# Patient Record
Sex: Male | Born: 1960 | Race: White | Hispanic: No | Marital: Married | Smoking: Never smoker
Health system: Southern US, Community
[De-identification: ages and names within clinical notes are randomized; demographics above are authoritative.]

## PROBLEM LIST (undated history)

## (undated) DIAGNOSIS — K5792 Diverticulitis of intestine, part unspecified, without perforation or abscess without bleeding: Secondary | ICD-10-CM

## (undated) DIAGNOSIS — R269 Unspecified abnormalities of gait and mobility: Secondary | ICD-10-CM

## (undated) DIAGNOSIS — M79673 Pain in unspecified foot: Secondary | ICD-10-CM

## (undated) DIAGNOSIS — K5730 Diverticulosis of large intestine without perforation or abscess without bleeding: Secondary | ICD-10-CM

## (undated) DIAGNOSIS — E785 Hyperlipidemia, unspecified: Secondary | ICD-10-CM

## (undated) DIAGNOSIS — M25522 Pain in left elbow: Secondary | ICD-10-CM

## (undated) DIAGNOSIS — K635 Polyp of colon: Secondary | ICD-10-CM

## (undated) DIAGNOSIS — D239 Other benign neoplasm of skin, unspecified: Secondary | ICD-10-CM

## (undated) DIAGNOSIS — Z8739 Personal history of other diseases of the musculoskeletal system and connective tissue: Secondary | ICD-10-CM

## (undated) DIAGNOSIS — M2170 Unequal limb length (acquired), unspecified site: Secondary | ICD-10-CM

## (undated) DIAGNOSIS — M1990 Unspecified osteoarthritis, unspecified site: Secondary | ICD-10-CM

## (undated) DIAGNOSIS — M7580 Other shoulder lesions, unspecified shoulder: Secondary | ICD-10-CM

## (undated) DIAGNOSIS — T7840XA Allergy, unspecified, initial encounter: Secondary | ICD-10-CM

## (undated) DIAGNOSIS — M84362G Stress fracture, left tibia, subsequent encounter for fracture with delayed healing: Secondary | ICD-10-CM

## (undated) DIAGNOSIS — Z8619 Personal history of other infectious and parasitic diseases: Secondary | ICD-10-CM

## (undated) HISTORY — DX: Pain in unspecified foot: M79.673

## (undated) HISTORY — DX: Personal history of other infectious and parasitic diseases: Z86.19

## (undated) HISTORY — DX: Other benign neoplasm of skin, unspecified: D23.9

## (undated) HISTORY — DX: Allergy, unspecified, initial encounter: T78.40XA

## (undated) HISTORY — PX: COLONOSCOPY: SHX174

## (undated) HISTORY — PX: SHOULDER SURGERY: SHX246

## (undated) HISTORY — DX: Unspecified osteoarthritis, unspecified site: M19.90

## (undated) HISTORY — DX: Polyp of colon: K63.5

## (undated) HISTORY — DX: Personal history of other diseases of the musculoskeletal system and connective tissue: Z87.39

## (undated) HISTORY — DX: Pain in left elbow: M25.522

## (undated) HISTORY — DX: Unspecified abnormalities of gait and mobility: R26.9

## (undated) HISTORY — PX: POLYPECTOMY: SHX149

## (undated) HISTORY — DX: Diverticulosis of large intestine without perforation or abscess without bleeding: K57.30

## (undated) HISTORY — DX: Hyperlipidemia, unspecified: E78.5

## (undated) HISTORY — DX: Other shoulder lesions, unspecified shoulder: M75.80

## (undated) HISTORY — DX: Unequal limb length (acquired), unspecified site: M21.70

## (undated) HISTORY — DX: Diverticulitis of intestine, part unspecified, without perforation or abscess without bleeding: K57.92

## (undated) HISTORY — DX: Stress fracture, left tibia, subsequent encounter for fracture with delayed healing: M84.362G

---

## 1986-05-02 HISTORY — PX: OTHER SURGICAL HISTORY: SHX169

## 1996-05-02 HISTORY — PX: VASECTOMY: SHX75

## 1997-05-02 HISTORY — PX: APPENDECTOMY: SHX54

## 1997-10-18 ENCOUNTER — Inpatient Hospital Stay (HOSPITAL_COMMUNITY): Admission: RE | Admit: 1997-10-18 | Discharge: 1997-10-19 | Payer: Self-pay | Admitting: Surgery

## 2001-05-02 HISTORY — PX: REFRACTIVE SURGERY: SHX103

## 2005-08-08 ENCOUNTER — Ambulatory Visit: Payer: Self-pay | Admitting: Internal Medicine

## 2005-08-24 ENCOUNTER — Ambulatory Visit: Payer: Self-pay | Admitting: Internal Medicine

## 2006-01-06 ENCOUNTER — Ambulatory Visit: Payer: Self-pay | Admitting: Internal Medicine

## 2006-07-20 ENCOUNTER — Ambulatory Visit: Payer: Self-pay | Admitting: Internal Medicine

## 2006-07-20 LAB — CONVERTED CEMR LAB
HDL: 34.3 mg/dL — ABNORMAL LOW (ref >39.0)
VLDL: 12 mg/dL (ref 0–40)

## 2006-11-14 ENCOUNTER — Ambulatory Visit: Payer: Self-pay | Admitting: Internal Medicine

## 2006-11-14 DIAGNOSIS — E785 Hyperlipidemia, unspecified: Secondary | ICD-10-CM | POA: Insufficient documentation

## 2006-11-14 LAB — CONVERTED CEMR LAB
Cholesterol, target level: 200 mg/dL
HDL goal, serum: 40 mg/dL
LDL Goal: 130 mg/dL

## 2006-11-17 ENCOUNTER — Encounter: Payer: Self-pay | Admitting: Internal Medicine

## 2006-11-17 ENCOUNTER — Encounter (INDEPENDENT_AMBULATORY_CARE_PROVIDER_SITE_OTHER): Payer: Self-pay | Admitting: *Deleted

## 2006-11-17 LAB — CONVERTED CEMR LAB
AST: 30 units/L (ref 0–37)
Cholesterol: 93 mg/dL (ref 0–200)

## 2007-03-16 ENCOUNTER — Ambulatory Visit: Payer: Self-pay | Admitting: Internal Medicine

## 2007-03-23 LAB — CONVERTED CEMR LAB
Cholesterol: 119 mg/dL (ref 0–200)
HDL: 32.1 mg/dL — ABNORMAL LOW (ref >39.0)
Total CHOL/HDL Ratio: 3.7
Triglycerides: 55 mg/dL (ref 0–149)

## 2007-04-24 ENCOUNTER — Ambulatory Visit: Payer: Self-pay | Admitting: Internal Medicine

## 2007-04-24 DIAGNOSIS — R109 Unspecified abdominal pain: Secondary | ICD-10-CM | POA: Insufficient documentation

## 2007-04-24 DIAGNOSIS — R350 Frequency of micturition: Secondary | ICD-10-CM

## 2007-04-24 DIAGNOSIS — C4491 Basal cell carcinoma of skin, unspecified: Secondary | ICD-10-CM | POA: Insufficient documentation

## 2007-04-24 LAB — CONVERTED CEMR LAB
Protein, U semiquant: NEGATIVE
Urobilinogen, UA: NEGATIVE
WBC Urine, dipstick: NEGATIVE

## 2007-04-25 ENCOUNTER — Encounter: Payer: Self-pay | Admitting: Internal Medicine

## 2007-05-23 ENCOUNTER — Encounter: Payer: Self-pay | Admitting: Internal Medicine

## 2008-01-09 ENCOUNTER — Ambulatory Visit: Payer: Self-pay | Admitting: Internal Medicine

## 2008-01-09 DIAGNOSIS — R109 Unspecified abdominal pain: Secondary | ICD-10-CM | POA: Insufficient documentation

## 2008-01-09 LAB — CONVERTED CEMR LAB
Blood in Urine, dipstick: NEGATIVE
HDL goal, serum: 40 mg/dL
Ketones, urine, test strip: NEGATIVE
LDL Goal: 100 mg/dL
Nitrite: NEGATIVE
Protein, U semiquant: NEGATIVE
WBC Urine, dipstick: NEGATIVE
pH: 7

## 2008-01-14 ENCOUNTER — Encounter (INDEPENDENT_AMBULATORY_CARE_PROVIDER_SITE_OTHER): Payer: Self-pay | Admitting: *Deleted

## 2008-01-14 LAB — CONVERTED CEMR LAB
Albumin: 4 g/dL (ref 3.5–5.2)
Alkaline Phosphatase: 84 units/L (ref 39–117)
Basophils Absolute: 0.1 10*3/uL (ref 0.0–0.1)
Basophils Relative: 0.8 % (ref 0.0–3.0)
Cholesterol: 110 mg/dL (ref 0–200)
Eosinophils Absolute: 0.5 10*3/uL (ref 0.0–0.7)
HCT: 43.5 % (ref 39.0–52.0)
HDL: 24.6 mg/dL — ABNORMAL LOW (ref >39.0)
MCHC: 35.7 g/dL (ref 30.0–36.0)
MCV: 92.2 fL (ref 78.0–100.0)
Monocytes Absolute: 0.6 10*3/uL (ref 0.1–1.0)
Neutrophils Relative %: 50.6 % (ref 43.0–77.0)
RBC: 4.71 M/uL (ref 4.22–5.81)
Total Protein: 7.1 g/dL (ref 6.0–8.3)
VLDL: 11 mg/dL (ref 0–40)

## 2008-01-25 ENCOUNTER — Ambulatory Visit: Payer: Self-pay | Admitting: Internal Medicine

## 2008-01-25 ENCOUNTER — Encounter (INDEPENDENT_AMBULATORY_CARE_PROVIDER_SITE_OTHER): Payer: Self-pay | Admitting: *Deleted

## 2008-01-25 LAB — CONVERTED CEMR LAB
OCCULT 1: NEGATIVE
OCCULT 2: NEGATIVE
OCCULT 3: NEGATIVE

## 2009-03-19 ENCOUNTER — Telehealth (INDEPENDENT_AMBULATORY_CARE_PROVIDER_SITE_OTHER): Payer: Self-pay | Admitting: *Deleted

## 2009-11-06 ENCOUNTER — Encounter: Payer: Self-pay | Admitting: Internal Medicine

## 2010-06-01 NOTE — Letter (Signed)
Summary: [facility]  [facility]   Imported By: Lennie Odor 11/26/2009 09:21:12  _____________________________________________________________________  External Attachment:    Type:   Image     Comment:   External Document

## 2010-06-17 ENCOUNTER — Encounter: Payer: Self-pay | Admitting: Internal Medicine

## 2010-06-17 ENCOUNTER — Other Ambulatory Visit: Payer: Self-pay | Admitting: Internal Medicine

## 2010-06-17 ENCOUNTER — Ambulatory Visit (INDEPENDENT_AMBULATORY_CARE_PROVIDER_SITE_OTHER): Payer: 59 | Admitting: Internal Medicine

## 2010-06-17 DIAGNOSIS — Z0000 Encounter for general adult medical examination without abnormal findings: Secondary | ICD-10-CM

## 2010-06-17 DIAGNOSIS — E785 Hyperlipidemia, unspecified: Secondary | ICD-10-CM

## 2010-06-17 DIAGNOSIS — T887XXA Unspecified adverse effect of drug or medicament, initial encounter: Secondary | ICD-10-CM

## 2010-06-17 DIAGNOSIS — M79609 Pain in unspecified limb: Secondary | ICD-10-CM

## 2010-06-17 DIAGNOSIS — Z125 Encounter for screening for malignant neoplasm of prostate: Secondary | ICD-10-CM

## 2010-06-17 LAB — HEPATIC FUNCTION PANEL
Albumin: 4.2 g/dL (ref 3.5–5.2)
Total Bilirubin: 0.8 mg/dL (ref 0.3–1.2)

## 2010-06-17 LAB — CBC WITH DIFFERENTIAL/PLATELET
Basophils Absolute: 0 10*3/uL (ref 0.0–0.1)
Eosinophils Absolute: 0.4 10*3/uL (ref 0.0–0.7)
Lymphocytes Relative: 24.5 % (ref 12.0–46.0)
MCHC: 34.5 g/dL (ref 30.0–36.0)
Neutrophils Relative %: 60.6 % (ref 43.0–77.0)
RDW: 12.3 % (ref 11.5–14.6)

## 2010-06-17 LAB — BASIC METABOLIC PANEL
BUN: 16 mg/dL (ref 6–23)
Calcium: 9.3 mg/dL (ref 8.4–10.5)
GFR: 75.13 mL/min (ref >60.00)
Glucose, Bld: 91 mg/dL (ref 70–99)
Sodium: 141 mEq/L (ref 135–145)

## 2010-06-17 LAB — PSA: PSA: 0.47 ng/mL (ref 0.10–4.00)

## 2010-06-23 NOTE — Assessment & Plan Note (Signed)
Summary: pain in left shin///sph--has cpx on 3/30--can he get his labs...   Vital Signs:  Patient profile:   50 year old male Height:      71.5 inches Weight:      217 pounds BMI:     29.95 Temp:     97.5 degrees F oral Pulse rate:   80 / minute Resp:     14 per minute BP sitting:   132 / 88  (left arm) Cuff size:   large  Vitals Entered By: Shonna Chock CMA (June 17, 2010 8:19 AM) CC: Pain left shin x 7-8 months, no known cause . Fasting for labs (pending CPX) , Lower Extremity Joint pain   CC:  Pain left shin x 7-8 months, no known cause . Fasting for labs (pending CPX) , and Lower Extremity Joint pain.  History of Present Illness:    Mr. Keith Schmitt has had waxing & waning  L shin pain  X 7 months ; this has been an intermittent issue for years. It started 6 months  after he "rolled" his L ankle in 05/2009. He also has had intermittent L  elbow pain which he attributed to Vytorin, but it recurred off the statin. This was diagnosed as soft tissue injury based on MRI by [facility].   The patient denies swelling, redness, and weakness.  The pain is described as sharp and activity related.  The patient denies the following symptoms: fever, rash, photosensitivity, eye symptoms, diarrhea, and dysuria. Rx: compression device , ice & heat have  helped some.  Allergies: No Known Drug Allergies  Physical Exam  General:  well-nourished,in no acute distress; alert,appropriate and cooperative throughout examination Pulses:  R and L dorsalis pedis and posterior tibial pulses are full and equal bilaterally Extremities:  No clubbing, cyanosis, edema, or deformity noted .Neg Homan's ; slight tenderness L shin lateral to Tibia Neurologic:  alert & oriented X3, strength normal in all extremities, and DTRs symmetrical and normal.   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 1 by mouth qd 2)  Adult Aspirin Ec Low Strength 81 Mg Tbec (Aspirin) .Marland Kitchen.. 1 by mouth qd  Patient Instructions: 1)  Glucosamine -chondroitin once daily until seen by Dr Darrick Penna. .   Orders Added: 1)  Est. Patient Level III [86578] 2)  Sports Medicine [Sports Med]  Appended Document: pain in left shin///sph--has cpx on 3/30--can he get his labs.Marland KitchenMarland Kitchen

## 2010-06-28 ENCOUNTER — Encounter: Payer: Self-pay | Admitting: Internal Medicine

## 2010-07-07 ENCOUNTER — Ambulatory Visit (INDEPENDENT_AMBULATORY_CARE_PROVIDER_SITE_OTHER): Payer: 59 | Admitting: Sports Medicine

## 2010-07-07 ENCOUNTER — Encounter: Payer: Self-pay | Admitting: Sports Medicine

## 2010-07-07 DIAGNOSIS — M2170 Unequal limb length (acquired), unspecified site: Secondary | ICD-10-CM

## 2010-07-07 DIAGNOSIS — R269 Unspecified abnormalities of gait and mobility: Secondary | ICD-10-CM

## 2010-07-07 DIAGNOSIS — M79609 Pain in unspecified limb: Secondary | ICD-10-CM

## 2010-07-13 NOTE — Letter (Signed)
Summary: *Consult Note  [facility]  [address]   [place], [place] [address]   Phone: [phone]  Fax: [phone]    Re:    TAMER BAUGHMAN DOB:    Aug 27, 1960 Marga Melnick, MD [facility] 07/07/10   Dear Alfonse Flavors:    Thank you for requesting that we see the above patient for consultation.  A copy of the detailed office note will be sent under separate cover, for your review.  Evaluation today is consistent with:  1)  ABNORMALITY OF GAIT (ICD-781.2) 2)  UNEQUAL LEG LENGTH (ICD-736.81) 3) chronic left leg pain   Our recommendation is for: biomechanical correction of his significant changes with tibial torsion, unequal leg length and trendelenburg gait with excessive forefoot pronation cycle.  If this helps we will make him a permanent orthotic to relieve this stress.   New Orders include:  1)  Consultation Level III [13086]   New Medications started today include:    After today's visit, the patients current medications include: 1)  NIASPAN 500 MG TBCR (NIACIN (ANTIHYPERLIPIDEMIC)) 1 by mouth qd 2)  ADULT ASPIRIN EC LOW STRENGTH 81 MG  TBEC (ASPIRIN) 1 by mouth qd   Thank you for this consultation.  If you have any further questions regarding the care of this patient, please do not hesitate to contact me @ [phone].  Thank you for this opportunity to look after your patient.  Sincerely,  Vincent Gros MD

## 2010-07-13 NOTE — Assessment & Plan Note (Signed)
Summary: NP,L LEG PAIN,PER DR.HOPPER,MC   Vital Signs:  Patient profile:   50 year old male Height:      72 inches Weight:      212 pounds Pulse rate:   66 / minute BP sitting:   155 / 90  (right arm)  Vitals Entered By: Rochele Pages RN (July 07, 2010 10:20 AM) CC: lt shin pain x 8 months,    CC:  lt shin pain x 8 months and .  History of Present Illness: Pt presents to clinic for evaluation of left shin pain x 8 months.   Has hx of shin splint since age 49, but has always improved after 1-2 weeks of rest until this episode. Hx of sprained ankle Jan 2011- top of foot.  Had continued pain for 5 months. Stayed off of ankle for 6 months while ankle was healing.  Started having L shin pain Jul 2011 with everyday walking. Aug 2011 developed lt heel tenderness- started using gel insoles and new tennis shoes with heel support.   Oct and Nov 2011 shin pain severe.  Ibuprofen and ice not significantly helpful.  Since then has gradually improved some  However, he avoids running and is concerned that increased activity will cause return or worse pain   Preventive Screening-Counseling & Management  Alcohol-Tobacco     Smoking Status: never  Allergies (verified): No Known Drug Allergies  Physical Exam  General:  Well-developed,well-nourished,in no acute distress; alert,appropriate and cooperative throughout examination Msk:  Irregularity of ant tib border on palpatation L>R Atrophy of lt calf - 4 cm smaller than rt rt calf measures 42 cm  lt calf measures 38 cm Lt ant shin not tender to percussion Slight soreness with resistance of ant tibialis muscle bilat Morton's foot Rt leg 2 cm longer than lt  Additional Exam:  MSK [place] Has marked inflammatory response with neovessels along border of left tibia ant and superior at point of pain Tibia does not show bone injury or edema There is some cortical thickening which apepars chronic   Impression & Recommendations:  Problem # 1:   LEG PAIN, LEFT (ICD-729.5) This is chronic and is associated with tibial torsion  I think he gets a recurrent injury at border of tibia with insertion of ant tibialis muscle  this shows on [place] evaluation  Problem # 2:  UNEQUAL LEG LENGTH (ICD-736.81) significant and enough to trigger a change in gait and recurrent stress to left leg  lift added to dress shoe and sport shoe scaphoid pads added as he has morton's foot with excessive med / lat role at forefoot  Problem # 3:  ABNORMALITY OF GAIT (ICD-781.2) with correctio of leg lenght and long arch support he has much more neutral gait  trial for 1 month  if this strategy works come back for custom orthotics  Complete Medication List: 1)  Niaspan 500 Mg Tbcr (Niacin (antihyperlipidemic)) .Marland Kitchen.. 1 by mouth qd 2)  Adult Aspirin Ec Low Strength 81 Mg Tbec (Aspirin) .Marland Kitchen.. 1 by mouth qd   Orders Added: 1)  Consultation Level III [13086]

## 2010-07-30 ENCOUNTER — Ambulatory Visit (INDEPENDENT_AMBULATORY_CARE_PROVIDER_SITE_OTHER): Payer: 59 | Admitting: Internal Medicine

## 2010-07-30 ENCOUNTER — Encounter: Payer: Self-pay | Admitting: Internal Medicine

## 2010-07-30 VITALS — BP 148/84 | HR 62 | Temp 98.4°F | Resp 14 | Ht 72.0 in | Wt 214.0 lb

## 2010-07-30 DIAGNOSIS — F329 Major depressive disorder, single episode, unspecified: Secondary | ICD-10-CM

## 2010-07-30 DIAGNOSIS — E785 Hyperlipidemia, unspecified: Secondary | ICD-10-CM

## 2010-07-30 DIAGNOSIS — M25529 Pain in unspecified elbow: Secondary | ICD-10-CM

## 2010-07-30 DIAGNOSIS — Z8249 Family history of ischemic heart disease and other diseases of the circulatory system: Secondary | ICD-10-CM

## 2010-07-30 DIAGNOSIS — D239 Other benign neoplasm of skin, unspecified: Secondary | ICD-10-CM

## 2010-07-30 DIAGNOSIS — Z0000 Encounter for general adult medical examination without abnormal findings: Secondary | ICD-10-CM

## 2010-07-30 DIAGNOSIS — M25522 Pain in left elbow: Secondary | ICD-10-CM

## 2010-07-30 MED ORDER — DULOXETINE HCL 60 MG PO CPEP: 60.0000 mg | ORAL_CAPSULE | Freq: Every day | ORAL | Status: DC

## 2010-07-30 NOTE — Assessment & Plan Note (Signed)
His advanced cholesterol panel (NMR  Lipoprofile) in 2007 revealed an LDL of 150 with  2011 total particles and 1604 small dense particles. HDL was  25 and triglycerides were 88. His LDL goal would be less than 100.  His advanced cholesterol panel as she's [place] heart panel was reviewed. Off statins his LDL has climbed to 166 with corresponding increases in ApoB and non-HDL cholesterol. Triglycerides are normal at 81 but his uric acid is 7.8 and insulin level 19. A1c was 5.1%. The CRP is 3; this is probably related to his ongoing musculoskeletal issues with a component of inflammation.

## 2010-07-30 NOTE — Progress Notes (Signed)
  Subjective:    Patient ID: Keith Schmitt, male    DOB: [birth year], 50 y.o.   MRN: [id number]  HPI Keith Schmitt is here for physical examination; he is having ongoing symptoms with the left upper and left lower extremity pain. These have been evaluated by experts in the field but no definitive diagnosis or resolution has been accomplished today. This does intermittently impacts his ability to exercise. Otherwise he typically exercises 4-5 times a week.  His past ocular history is been updated. His advance cholesterol panel has been reviewed and risk and options discussed.     Review of Systems Review of systems was completed in toto.  Positive responses include some intermittent blurring of vision and fatigue. He questions whether he is getting adequate rest, but he denies insomnia. He specifically denies palpitations, constipation, diarrhea, change in hair/skin/nails, or temperature intolerance.  He feels he does have significant anxiety and depression issues. These have not been treated. He feels that this is related to the lack of resolution of the musculoskeletal issues. Also the work stresses are playing a role he feels.       Objective:   Physical Exam he appears well-nourished and in no acute distress.  Pupils are equal round reactive to light; he has no scleral icterus.  Nares are patent; slight septal dislocation  The canals are clear; tympanic membranes are normal.  Oropharynx reveals no exudate or erythema. Hygiene is good  He does have some pattern alopecia; he has a beard and mustache.  Thyroid is normal to palpation.  He has a slow S4 with no significant murmurs or gallops  Chest is clear with no rales, rhonchi, or wheezes  Abdomen is nontender without masses organomegaly  All pulses are intact; no bruits were noted  The musculoskeletal exam reveals no significant deformities. He has full range of motion without pain. A tenosynovitis picture is not present  clinically.  He has no cyanosis,clubbing and no  edema.  He has a small varicocele on the left. Prostate is upper limits of normal; soft without nodularity  No inguinal, cervical or axillary  lymph nodes are palpable.   He is oriented x3; affect is slightly flat but he is communicative and interactive.       Assessment & Plan:  #1 annual physical examination  #2 musculoskeletal pain in the left upper extremity and left lower extremity  #3 Dyslipidemia  With significant wrist suggestive by 2 advance cholesterol panels and his family history of premature coronary disease  #4 anxiety/depression with both work  stressors and the stress of #2.  #5 benign prostatic hypertrophy  #6 surveillance colonoscopy  Indicated  as per standard of care, discussed. I've asked him to consider the colonoscopy.  #7 fatigue, multi-factorial   He wants to defer taking a statin for his cholesterol; his concerns it might aggravate the musculoskeletal symptoms. I did explain that this would not be an expected result and the risk would be less than 1 10,000  Even with  high-dose statin.

## 2010-07-30 NOTE — Assessment & Plan Note (Signed)
He is unsure as to the cell type; he believes it was either a basal cell or squamous cell cancer. He knows it was not a melanoma.

## 2010-07-30 NOTE — Patient Instructions (Signed)
Please consider a screening colonoscopy as per standard of care we discussed.  Please call if you want to consider starting a statin for the LDL associated risk  Consider taking Cymbalta which would address that with some pain issues as well as her anxiety/depression.  If the muscle skeletal symptoms persist or progress consideration should be given to seeing a rheumatologist.

## 2010-08-05 ENCOUNTER — Other Ambulatory Visit: Payer: Self-pay | Admitting: Sports Medicine

## 2010-08-05 ENCOUNTER — Ambulatory Visit (INDEPENDENT_AMBULATORY_CARE_PROVIDER_SITE_OTHER): Payer: 59 | Admitting: Sports Medicine

## 2010-08-05 ENCOUNTER — Ambulatory Visit
Admission: RE | Admit: 2010-08-05 | Discharge: 2010-08-05 | Disposition: A | Discharge status: Home / Self Care | Payer: 59 | Source: Ambulatory Visit | Attending: Sports Medicine | Admitting: Sports Medicine

## 2010-08-05 ENCOUNTER — Encounter: Payer: Self-pay | Admitting: Sports Medicine

## 2010-08-05 VITALS — BP 120/82

## 2010-08-05 DIAGNOSIS — R52 Pain, unspecified: Secondary | ICD-10-CM

## 2010-08-05 DIAGNOSIS — M2170 Unequal limb length (acquired), unspecified site: Secondary | ICD-10-CM

## 2010-08-05 DIAGNOSIS — M79609 Pain in unspecified limb: Secondary | ICD-10-CM

## 2010-08-05 NOTE — Assessment & Plan Note (Addendum)
Some burning pain is troublesome because I cannot really associate it with a specific injury. He does have some pain most of the day in evening in the evenings when he is relaxing. Nothing has particularly causes any relief and he really shows no improvement since our last visit.  With his persistent pain I think we need to x-ray the left tibia and if we find nothing on that to go ahead with an MRI. He could have pain related to an osteoid osteoma or some sort of reaction in the bone or bone marrow did not show up with advanced imaging.  Patient's tibial film does show some thickening of the cortex which could be an early stress fracture but is nonspecific. Without a clear mechanism of injury still continued to proceed with the MRI.

## 2010-08-05 NOTE — Patient Instructions (Signed)
Your MRI is scheduled for Wednesday, April 11 at 1 PM. They want you to arrival at 12:30 for registration. The appointment will be at the [address] location of [facility]. Phone number is [phone].

## 2010-08-05 NOTE — Assessment & Plan Note (Signed)
It is my well and looks improved with the corrections made to his left insole. However since this did not change his symptoms I doubt that this is the cause of his leg pain.  I will contact him for followup once I see the results of his x-ray and possible MRI.

## 2010-08-05 NOTE — Progress Notes (Signed)
  Subjective:    Patient ID: Keith Schmitt, male    DOB: 1960/12/22, 50 y.o.   MRN: [id number]  HPI Not much change in shin pain and still uncomfortable When started 9 mos ago there was not a specific trigger He had started back swimming and normal walking and got left shin pain  Left arm was fatigued last year MRI suggested tendon injury and he rested this thru summer Now this is starting to get some fatigue again although able to swim now about 1200M  Still w probs finding comf gait on left foot   Review of Systems Hx of borderline cholesterol - high LDL/ on no meds for this/ stopped statins in Nov 2010  Given Rx for Cymbalta but has not taken yet     Objective:   Physical Exam    NAD Percussion to the left shin does not create any pain Screws were pressure on the posterior tibialis border does not create pain Plantar flexion dorsiflexion eversion and inversion strength of the left lower extremity are all quite strong Measurement of the left calf is repeated today and still shows 3-4 cm smaller circumference suggestive of some atrophy.  Walking gait with the changes in the shoe inserts now looks level and does seem to control some of the rotation of the left tibia.    Assessment & Plan:

## 2010-08-05 NOTE — Progress Notes (Signed)
  Subjective:    Patient ID: Keith Schmitt, male    DOB: February 03, 1961, 50 y.o.   MRN: [id number]  HPI Patient is here today to followup his left shin pain which he says has not gotten any better but has not gotten any worse either. He still has the lifts that we placed in his shoes and feels that they may be helping but is uncertain at this point. This has been an ongoing injury for more than 8 months now so he is a little stressed about the shin.     Review of Systems     Objective:   Physical Exam        Assessment & Plan:

## 2010-08-05 NOTE — Assessment & Plan Note (Signed)
I advised him to continue using the correction for his unequal leg length in his shoes. This has not relieved his pain but does improve his gait.

## 2010-08-11 ENCOUNTER — Other Ambulatory Visit: Payer: 59

## 2010-08-13 ENCOUNTER — Ambulatory Visit
Admission: RE | Admit: 2010-08-13 | Discharge: 2010-08-13 | Disposition: A | Discharge status: Home / Self Care | Payer: 59 | Source: Ambulatory Visit | Attending: Sports Medicine | Admitting: Sports Medicine

## 2010-08-13 DIAGNOSIS — M79609 Pain in unspecified limb: Secondary | ICD-10-CM

## 2010-08-13 DIAGNOSIS — M2170 Unequal limb length (acquired), unspecified site: Secondary | ICD-10-CM

## 2010-08-24 ENCOUNTER — Telehealth: Payer: Self-pay | Admitting: *Deleted

## 2010-08-24 NOTE — Telephone Encounter (Signed)
Pt returned Dr. Darrick Penna call.  Gave him MRI and xray results.  He states his pain is improving, but not completely better.  States Dr. Darrick Penna left a message stating he wanted to talk to pt about his symptoms.  Advised pt dr Darrick Penna will be back Thursday, and I will ask him to call him back then.

## 2010-09-14 NOTE — Telephone Encounter (Signed)
I reviewed that MRI was not 100% normal.  Still consider possibility of incompletely healed tibial stress fracture.  Making some progress.  He will return if pain does not gradually subside.

## 2010-10-12 ENCOUNTER — Ambulatory Visit (INDEPENDENT_AMBULATORY_CARE_PROVIDER_SITE_OTHER): Payer: 59 | Admitting: Family Medicine

## 2010-10-12 ENCOUNTER — Encounter: Payer: Self-pay | Admitting: Family Medicine

## 2010-10-12 DIAGNOSIS — M2170 Unequal limb length (acquired), unspecified site: Secondary | ICD-10-CM

## 2010-10-12 DIAGNOSIS — M79609 Pain in unspecified limb: Secondary | ICD-10-CM

## 2010-10-12 NOTE — Assessment & Plan Note (Signed)
May be contributing to his lower leg symptoms on the left. - Given smaller set of heel lifts to place a left shoe and hopefully he'll be more comfortable.

## 2010-10-12 NOTE — Progress Notes (Signed)
  Subjective:    Patient ID: Keith Schmitt, male    DOB: Jun 09, 1960, 50 y.o.   MRN: [id number]  HPI 50 year old male to office for followup of left shin pain. Pain is unchanged from previous visit, he does not think it has gotten worse. MRI after last visit showed suspicion for possible healing stress fracture in the proximal tibia. Patient was wearing heel lift in left shoe for leg length discrepancy, but felt too awkward so he has not been using this for the past several weeks. Is not taking any medications for the pain. Denies any associated numbness or tingling. He has been swimming and riding the bike, but has not been running.   Review of Systems Per history of present illness otherwise negative    Objective:   Physical Exam GENERAL: Alert and oriented x3, no acute chest, pleasant SKIN: No rashes or lesions MSK: - Lower leg: Left lower leg with no visible deformity, swelling, bruising. Tender to palpation along the proximal tibia with increased pain with percussion over proximal anterior tibia. Pain with hop test. No calf tenderness. Good ankle strength. Right lower leg with no pain, tenderness, or weakness. - Gait: Left leg is shorter than the right.  With walking does have elevation of right hemi-pelvis due to leg length difference. NEURO: Sensation intact to light touch VASCULAR: Pulses 2+ and symmetric  MSK ultrasound: Right proximal tibia with no visible cortical defects or periosteal thickening. Does appear to have possible mild amount of periosteal edema. No significant increased Doppler flow appreciated today. Images saved  Did review x-ray from 08/05/10 showing possible healed proximal tibial stress fracture an MRI from 08/13/10 with similar findings.        Assessment & Plan:

## 2010-10-12 NOTE — Assessment & Plan Note (Addendum)
History, exam findings, and imaging highly suspicious for healing proximal left tibial stress fracture. He has not shown significant improvement with other treatments thus far. - Fitted with a long leg Aircast to wear throughout the day and with activity. - Is okay for him to continue biking and swimming. - Single leg length discrepancy may be contributing to his symptoms, he was given smaller set of heel lifts to place in his left shoe to see if this is more comfortable. - He will followup with [place] in 2-3 weeks for reevaluation, instructed to call with any questions or concerns.

## 2010-11-02 ENCOUNTER — Ambulatory Visit (INDEPENDENT_AMBULATORY_CARE_PROVIDER_SITE_OTHER): Payer: 59 | Admitting: Sports Medicine

## 2010-11-02 VITALS — BP 125/82

## 2010-11-02 DIAGNOSIS — M25522 Pain in left elbow: Secondary | ICD-10-CM

## 2010-11-02 DIAGNOSIS — M84362G Stress fracture, left tibia, subsequent encounter for fracture with delayed healing: Secondary | ICD-10-CM

## 2010-11-02 DIAGNOSIS — M79609 Pain in unspecified limb: Secondary | ICD-10-CM

## 2010-11-02 DIAGNOSIS — Z4789 Encounter for other orthopedic aftercare: Secondary | ICD-10-CM

## 2010-11-02 DIAGNOSIS — M25529 Pain in unspecified elbow: Secondary | ICD-10-CM

## 2010-11-02 NOTE — Patient Instructions (Addendum)
Increase biking as long as you are not having significant pain Start calcium with vitamin D - this may help heal stress fracture 1200-1500 of calcium and 800 IU of Vitamin D Do half squats, toe raises, and heel raises gradually build up to 3 sets of 15. Continue splinting for the next 6 weeks- follow up in 6 weeks  For left elbow Use theraband to do 30 bent arm rolls twice daily Once you are comfortable with this increase to 5 lb weights

## 2010-11-02 NOTE — Assessment & Plan Note (Signed)
He was given theraband and advised about curl and supination exercises to help strengthen the biceps tendon  We will recheck this on his return.

## 2010-11-02 NOTE — Progress Notes (Signed)
  Subjective:    Patient ID: Keith Schmitt, male    DOB: [birth year], 50 y.o.   MRN: [id number]  HPI  Pt presents to clinic for f/u of left tibial stress fx which he reports is about 5% improved.  He has gotten a new automatic car so that he does not have to push in clutch which was thought to be an aggravating factor. Riding stationary bike, which causes no problems.  Still having some "twinges of pain" with shifting weight.  Still having pain anterior aspect of shin.  He has now been wearing the long air cast for 3 weeks and when walking or standing has a more significant reduction in pain than he had before.  Pt also having pain in distal biceps tendon at elbow x 2 years.  Had MRI and was told the tendon was frayed. Rested last summer- felt better, but when started swimming and biking again pain returned.   Feels the muscle is fatigued, has been able to swim without much fatigue. However when he starts using weights he notices that the left arm is weaker and sometimes gets some pain around the insertion of the biceps into the elbow region.   Review of Systems     Objective:   Physical Exam No acute distress    No pain or tenderness in calf No pain in tibia with percussion Moderate arch on left  Slight splaying between 1st and 2nd toe on left Hop test neg  Left biceps intact muscle intact No swelling Full range of motion of the elbow   Assessment & Plan:

## 2010-11-02 NOTE — Assessment & Plan Note (Signed)
Gradual improvement in his leg pain. He should continue wearing the Aircast as much as possible for the next 6 weeks until I recheck him.

## 2010-11-02 NOTE — Assessment & Plan Note (Signed)
Today we added an anterior on to his air cast and this did seem to give him some additional compression. I also added some simple exercises to strengthen the muscles around the left leg. He should start supplementing calcium and vitamin D to be sure he is getting an adequate dose of this. We'll recheck this in 6 weeks.

## 2010-12-14 ENCOUNTER — Encounter: Payer: Self-pay | Admitting: Sports Medicine

## 2010-12-14 ENCOUNTER — Ambulatory Visit (INDEPENDENT_AMBULATORY_CARE_PROVIDER_SITE_OTHER): Payer: 59 | Admitting: Sports Medicine

## 2010-12-14 DIAGNOSIS — M84362G Stress fracture, left tibia, subsequent encounter for fracture with delayed healing: Secondary | ICD-10-CM

## 2010-12-14 DIAGNOSIS — M7580 Other shoulder lesions, unspecified shoulder: Secondary | ICD-10-CM | POA: Insufficient documentation

## 2010-12-14 DIAGNOSIS — M719 Bursopathy, unspecified: Secondary | ICD-10-CM

## 2010-12-14 DIAGNOSIS — M25529 Pain in unspecified elbow: Secondary | ICD-10-CM

## 2010-12-14 DIAGNOSIS — M25522 Pain in left elbow: Secondary | ICD-10-CM

## 2010-12-14 DIAGNOSIS — Z4789 Encounter for other orthopedic aftercare: Secondary | ICD-10-CM

## 2010-12-14 NOTE — Progress Notes (Signed)
  Subjective:    Patient ID: Keith Schmitt, male    DOB: [birth year], 50 y.o.   MRN: [id number]  HPI Continues to show slow progress At first could not walk 100 ft With air cast now can walk normally at work and home without much pain Still sharp pain at times in upper tibia  Was swimming for xtrain and left shoulder now hurts w elevation Has had this in past  xtrain on bike is OK   Review of Systems     Objective:   Physical Exam NAD  Left tibia shows no swelling Calf has increased 1.25 cms in girth at 4 in below tib tub (initially 4 cm difference)  Walks on toes and does toe ups with no pain Walks on heels w heel pain but no tibial pain   Shoulder has full ROM Painful arc at 90 to 180 + Hawkins + empty can but mild  Strength in RC muscles in normal Speeds and yergason's are now wnl       Assessment & Plan:

## 2010-12-14 NOTE — Assessment & Plan Note (Signed)
Still has some pain  Tests today on BT were less painful

## 2010-12-14 NOTE — Assessment & Plan Note (Signed)
This appears to be early will have him do some scap stabilization Add theraband exercises for RC mm to emphasize supraspinatus  Reck on RTC in 6 wks

## 2010-12-14 NOTE — Assessment & Plan Note (Signed)
I think he will need to use the air cast based on his sxs so since he is worse out of the cast should continue this for next 6 wks  Keep up calf raises and squats Add some resisted theraband activity lower leg Increase his walking

## 2011-01-25 ENCOUNTER — Encounter: Payer: Self-pay | Admitting: Sports Medicine

## 2011-01-25 ENCOUNTER — Ambulatory Visit (INDEPENDENT_AMBULATORY_CARE_PROVIDER_SITE_OTHER): Payer: 59 | Admitting: Sports Medicine

## 2011-01-25 VITALS — BP 152/84 | HR 70

## 2011-01-25 DIAGNOSIS — M7580 Other shoulder lesions, unspecified shoulder: Secondary | ICD-10-CM

## 2011-01-25 DIAGNOSIS — Z4789 Encounter for other orthopedic aftercare: Secondary | ICD-10-CM

## 2011-01-25 DIAGNOSIS — M84362G Stress fracture, left tibia, subsequent encounter for fracture with delayed healing: Secondary | ICD-10-CM

## 2011-01-25 DIAGNOSIS — M67919 Unspecified disorder of synovium and tendon, unspecified shoulder: Secondary | ICD-10-CM

## 2011-01-25 NOTE — Assessment & Plan Note (Signed)
Today all test for bicipital tenderness were negative  He still has some mild impingement signs but good strength.  Continue with conservative care and continue to keep his swimming a moderate level

## 2011-01-25 NOTE — Progress Notes (Signed)
  Subjective:    Patient ID: Keith Schmitt, male    DOB: 10-08-1960, 50 y.o.   MRN: [id number]  HPI  Pt presents to clinic for f/u of lt tibial stress fx which he reports is about 80% improved.   Still has pain with walking more than 1/4 to 1/2 mile.  Wearing aircast at all times when weight bearing. Compliant with home strengthening exercises. Feels that he has progressed significantly during the past month.   Had slight set back 1 week ago when he went to visit daughter at college and did significant walking- this caused some pain.   Left shoulder pain is improving Feels clicking and pain with abduction and elevation above 90 degrees Compliant with HEP   Review of Systems     Objective:   Physical Exam  Lt leg 4 cm below tibial tuberosity is 37 cm Rt leg 4 cm below tibial tuberosity is 35 cm   Full ROM lt shoulder Neg speeds and yergason's  Slight clicking over shoulder with full abduction and elevation Mildly positive hawkins Empty can strong Strength good overall      Assessment & Plan:

## 2011-01-25 NOTE — Patient Instructions (Addendum)
Continue on same plan for 1 month During 2nd month- wean aircast at your own pace based on pain level Continue taking calcium and Vit D Follow up in 2 months  Continue swimming exercise but moderate based on pain and stiffness in left shoulder

## 2011-01-25 NOTE — Assessment & Plan Note (Signed)
Since since he is showing progress we will continue with full use of the Aircast for 1 more month. After that he will try to wean out of this by using it intermittently.  A scan of his upper tibia on ultrasound suggested some thickening of the cortex and some possible healing.  We will continue the same exercises. Recheck in 2 months.

## 2011-03-28 ENCOUNTER — Ambulatory Visit (INDEPENDENT_AMBULATORY_CARE_PROVIDER_SITE_OTHER): Payer: 59 | Admitting: Sports Medicine

## 2011-03-28 ENCOUNTER — Encounter: Payer: Self-pay | Admitting: Sports Medicine

## 2011-03-28 ENCOUNTER — Ambulatory Visit
Admission: RE | Admit: 2011-03-28 | Discharge: 2011-03-28 | Disposition: A | Discharge status: Home / Self Care | Payer: 59 | Source: Ambulatory Visit | Attending: Sports Medicine | Admitting: Sports Medicine

## 2011-03-28 VITALS — BP 149/98 | HR 82

## 2011-03-28 DIAGNOSIS — M84362G Stress fracture, left tibia, subsequent encounter for fracture with delayed healing: Secondary | ICD-10-CM

## 2011-03-28 DIAGNOSIS — Z4789 Encounter for other orthopedic aftercare: Secondary | ICD-10-CM

## 2011-03-28 NOTE — Assessment & Plan Note (Addendum)
This stress fracture continues to be slow to heal. We discussed adding a  bone stimulator to try to improve bone growth.  The patient would like to try to continue with the aircast for now.Tp speed helaing over the next month, he is open to trying the bone stimulator.  If he doesn't improve with the bone stimulator we will plan on additional consultation with orthopedics.  I will see if we can get approval of bone stimulator since the tibial xrays are normalizing.  Clinically still enough sxs that I think this is reasonable to try.

## 2011-03-28 NOTE — Progress Notes (Signed)
  Subjective:    Patient ID: Keith Schmitt, male    DOB: 1961/05/01, 50 y.o.   MRN: [id number]  HPI 50 y/o male is here to follow up on tibial stress fracture.  He was initially supposed to wean the aircast in October but he continued to have pain even with the aircast on so he was unable to start the wean until the past 2 weeks.  The pain started to come back immediately.  He only went without the aircast at home.  He was able to do yard work with the aircast without pain over the past weekend.    Still not able to walk regularly but 80% of pain resolved with most activities No night pain   Review of Systems     Objective:   Physical Exam  Left leg No edema, no erythema No tenderness to palpation over the tibia No tenderness to palpation over the calf Sensation is normal  XRAY This is compared to April 2012 and the cystic area has healed There is now cortical thickening and no periosteal swelling noted Film appears essentially normal at this point      Assessment & Plan:

## 2011-05-04 ENCOUNTER — Ambulatory Visit (INDEPENDENT_AMBULATORY_CARE_PROVIDER_SITE_OTHER): Payer: 59 | Admitting: Sports Medicine

## 2011-05-04 VITALS — BP 132/86

## 2011-05-04 DIAGNOSIS — M84362G Stress fracture, left tibia, subsequent encounter for fracture with delayed healing: Secondary | ICD-10-CM

## 2011-05-04 DIAGNOSIS — M79673 Pain in unspecified foot: Secondary | ICD-10-CM | POA: Insufficient documentation

## 2011-05-04 DIAGNOSIS — Z4789 Encounter for other orthopedic aftercare: Secondary | ICD-10-CM

## 2011-05-04 DIAGNOSIS — M79609 Pain in unspecified limb: Secondary | ICD-10-CM

## 2011-05-04 NOTE — Assessment & Plan Note (Signed)
We gave him a soft heel cup to try on left side  Using ice at end of day may be helpful  We will monitor when he returns for his stress fracture

## 2011-05-04 NOTE — Patient Instructions (Signed)
Continue gradual increase in activity Pain is still your guide - keep level less than 3/10 Expect some pain on rainy days and particularly cool days after temperature change  For heel Icing at end of day  Try heel cup for comfort  Add some strength exercises for left lower leg as tolerated  Biking is good starter to return to activity  Recheck in 2 months

## 2011-05-04 NOTE — Progress Notes (Signed)
  Subjective:    Patient ID: Keith Schmitt, male    DOB: [birth year], 51 y.o.   MRN: [id number]  HPI  Pt presents to clinic for f/u of Lt tibial stress fx.   Feels 10% improved.  Has been out of aircast for 1 week. Wore aircast on new years eve due to increased standing. Has been biking without problems.  Increased heel pain on lt, which causes problems walking. Also has occasional lt heel pain when seated.    Bone stimulator - using  Twice daily/ possibly some clinical response p 2 wks     Review of Systems     Objective:   Physical Exam  No palpable tenderness on lt tibia No pain with percussion   Excellent great toe motion lt foot  Tenderness to palpation on medial border of lt calcaneus   MSK u/s- normal left PF thickness, calcaneal shape normal except for slight spurring on medial border Slight hypoechoic change noted near the area of spurring      Assessment & Plan:

## 2011-05-04 NOTE — Assessment & Plan Note (Signed)
Clinically he has shown improvement with the bone stimulator  Use this for a couple more weeks  Gradually wean out of the Aircast as he is doing  Recheck in 2 months

## 2011-07-06 ENCOUNTER — Encounter: Payer: Self-pay | Admitting: Sports Medicine

## 2011-07-06 ENCOUNTER — Ambulatory Visit (INDEPENDENT_AMBULATORY_CARE_PROVIDER_SITE_OTHER): Payer: 59 | Admitting: Sports Medicine

## 2011-07-06 VITALS — BP 151/93 | HR 57

## 2011-07-06 DIAGNOSIS — M79673 Pain in unspecified foot: Secondary | ICD-10-CM

## 2011-07-06 DIAGNOSIS — M79609 Pain in unspecified limb: Secondary | ICD-10-CM

## 2011-07-06 DIAGNOSIS — Z4789 Encounter for other orthopedic aftercare: Secondary | ICD-10-CM

## 2011-07-06 DIAGNOSIS — M84362G Stress fracture, left tibia, subsequent encounter for fracture with delayed healing: Secondary | ICD-10-CM

## 2011-07-06 NOTE — Progress Notes (Signed)
  Subjective:    Patient ID: Keith Schmitt, male    DOB: 29-Oct-1960, 51 y.o.   MRN: [id number]  HPI  Pt presents to clinic for f/u of lt tibial stress fx which he states is improving. Out of aircast x 6 weeks. Used bone stimulator for the month of December. Started having skin tenderness after a month of using bone stimulator so he discontinued. Has pinching or stinging in the area of stress fx daily with certain movements like forward flexion at ankle while standing. He continues to have left heel pain which he attributes to altered gait while stress fx has been healing. Has been using heel cup for lt heel which has been helpful.   Rides stationary bike for 30 minutes daily and swimming 1200 meters x2 days per week without problems.    Review of Systems     Objective:   Physical Exam  Left leg exam: No swelling or ttp over left anterior tibia  Shoulder exam: Neg empty can bilat Neg hawkins bilat Biceps testing strong bilat  Lt heel- ttp over central calcaneus PF stretches well        Assessment & Plan:

## 2011-07-06 NOTE — Assessment & Plan Note (Signed)
Clinically this has healed and we can see him as needed depending on future symptoms  He should continue to do a few calf and anterior shin exercises to maintain his strength  He should not begin a running program but start walking first on elliptical and treadmill before he tries any running

## 2011-07-06 NOTE — Patient Instructions (Signed)
Continue biking and swimming, and heel raises  Strength supplements- creatine or vega 1 are safer Antioxidants are also important  Try ellipitcal and treadmill without any problems before you start outdoor running   Please follow up as needed  Thank you for seeing [place] today!

## 2011-07-06 NOTE — Assessment & Plan Note (Signed)
The plantar fascia is normal  Small heel spur is not likely to resolve   he should continue using a heel cup for pain relief Prn  recheck

## 2011-07-13 ENCOUNTER — Ambulatory Visit: Payer: 59 | Admitting: Sports Medicine

## 2012-01-24 ENCOUNTER — Encounter: Payer: Self-pay | Admitting: Internal Medicine

## 2012-01-24 ENCOUNTER — Ambulatory Visit (INDEPENDENT_AMBULATORY_CARE_PROVIDER_SITE_OTHER): Payer: 59 | Admitting: Internal Medicine

## 2012-01-24 ENCOUNTER — Encounter: Payer: Self-pay | Admitting: Cardiovascular Disease

## 2012-01-24 VITALS — BP 130/82 | HR 59 | Temp 98.0°F | Resp 12 | Ht 72.03 in | Wt 207.2 lb

## 2012-01-24 DIAGNOSIS — Z0000 Encounter for general adult medical examination without abnormal findings: Secondary | ICD-10-CM

## 2012-01-24 DIAGNOSIS — Z23 Encounter for immunization: Secondary | ICD-10-CM

## 2012-01-24 DIAGNOSIS — R079 Chest pain, unspecified: Secondary | ICD-10-CM

## 2012-01-24 DIAGNOSIS — E785 Hyperlipidemia, unspecified: Secondary | ICD-10-CM

## 2012-01-24 DIAGNOSIS — R0789 Other chest pain: Secondary | ICD-10-CM

## 2012-01-24 DIAGNOSIS — Z8249 Family history of ischemic heart disease and other diseases of the circulatory system: Secondary | ICD-10-CM

## 2012-01-24 LAB — CBC WITH DIFFERENTIAL/PLATELET
Basophils Absolute: 0 10*3/uL (ref 0.0–0.1)
HCT: 47 % (ref 39.0–52.0)
Hemoglobin: 15.8 g/dL (ref 13.0–17.0)
Lymphs Abs: 1.6 10*3/uL (ref 0.7–4.0)
MCHC: 33.7 g/dL (ref 30.0–36.0)
MCV: 93.9 fl (ref 78.0–100.0)
Monocytes Absolute: 0.6 10*3/uL (ref 0.1–1.0)
Monocytes Relative: 8.5 % (ref 3.0–12.0)
Neutro Abs: 4.5 10*3/uL (ref 1.4–7.7)
RDW: 12.9 % (ref 11.5–14.6)

## 2012-01-24 NOTE — Progress Notes (Signed)
Subjective:    Patient ID: Keith Schmitt, male    DOB: 1961-03-11, 51 y.o.   MRN: [id number]  HPI  Keith Schmitt is here for a physical;acute issues include  L tibial pain. He's had chronic intermittent pain in the left tibial area for 2 years. This was evaluated extensively by Dr. Darrick Penna; he wore an Aircast for 7 months. He avoids walking in place of swimming and cycling to prevent aggravating the pain.  Also for several months he describes fatigue ,non exertional dyspnea and R parasternal chest tightness.  He has not had a colonoscopy; this was deferred because of the leg issues last year. He denies melena or rectal bleeding. Standard of care reviewed      Review of Systems  Constitutional: no fever, chills, sweats. 20# weight loss with CVE.  Musculoskeletal:no  muscle cramps or pain; no  joint stiffness, redness, or swelling Skin:no rash, color change Neuro: no weakness; incontinence (stool/urine); numbness and tingling Heme:no lymphadenopathy; abnormal bruising or bleeding        Objective:   Physical Exam Gen.:  well-nourished in appearance. Alert, appropriate and cooperative throughout exam. Head: Normocephalic without obvious abnormalities; goatee & pattern alopecia  Eyes: No corneal or conjunctival inflammation noted. Pupils equal round reactive to light and accommodation. Fundal exam is benign without hemorrhages, exudate, papilledema. Extraocular motion intact. Vision grossly normal. Ears: External  ear exam reveals no significant lesions or deformities. Canals clear .TMs normal. Hearing is grossly normal bilaterally. Nose: External nasal exam reveals no deformity or inflammation. Nasal mucosa are pink and moist. No lesions or exudates noted.  Mouth: Oral mucosa and oropharynx reveal no lesions or exudates. Teeth in good repair. Neck: No deformities, masses, or tenderness noted. Range of motion & Thyroid normal Lungs: Normal respiratory effort; chest expands symmetrically.  Lungs are clear to auscultation without rales, wheezes, or increased work of breathing. Heart: Slow  rate and regular rhythm. Normal S1 and S2. No gallop, click, or rub. S4 w/o  murmur. Abdomen: Bowel sounds normal; abdomen soft and nontender. No masses, organomegaly or hernias noted. Genitalia/ DRE: Genitalia normal except for left varices. Prostate is normal without enlargement, asymmetry, nodularity, or induration.           Musculoskeletal/extremities: No deformity or scoliosis noted of  the thoracic or lumbar spine. No clubbing, cyanosis, edema, or deformity noted. Range of motion  normal .Tone & strength  normal.Joints normal. Nail health  good. Vascular: Carotid, radial artery, dorsalis pedis and  posterior tibial pulses are full and equal. No bruits present. Neurologic: Alert and oriented x3. Deep tendon reflexes symmetrical and normal.         Skin: Intact without suspicious lesions or rashes. Lymph: No cervical, axillary, or inguinal lymphadenopathy present. Psych: Mood and affect are normal. Normally interactive                                                                                         Assessment & Plan:  #1 comprehensive physical exam; no acute findings #2 leg pain; Orthopedic assessment recommended. He wishes to wait  #3 dyspnea and atypical chest pain in the context of family history  of premature coronary disease/MI and dyslipidemia with statin intolerance. Advanced cholesterol testing (NMR lipoprofile) should be repeated at the old results cannot be retrieved. Due to his knee; he states he is unable to run on treadmill. Referral will be made to cardiology for definitive testing and risk assessment  #4 due for colonoscopic surveillance; this should be deferred until #3 can be evaluated Plan: see Orders

## 2012-01-24 NOTE — Patient Instructions (Addendum)
Review and correct the record as indicated. Please share record with all medical staff seen.  If you activate My Chart; the results can be released to you as soon as they populate from the lab. If you choose not to use this program; the labs have to be reviewed, copied & mailed   causing a delay in getting the results to you.  Please FAX NMR Lipoprofile to [phone]

## 2012-01-25 ENCOUNTER — Encounter: Payer: Self-pay | Admitting: Internal Medicine

## 2012-01-25 ENCOUNTER — Encounter: Payer: Self-pay | Admitting: Cardiovascular Disease

## 2012-01-25 ENCOUNTER — Ambulatory Visit (INDEPENDENT_AMBULATORY_CARE_PROVIDER_SITE_OTHER): Payer: 59 | Admitting: Cardiovascular Disease

## 2012-01-25 VITALS — BP 142/88 | HR 62 | Ht 72.0 in | Wt 206.8 lb

## 2012-01-25 DIAGNOSIS — E785 Hyperlipidemia, unspecified: Secondary | ICD-10-CM

## 2012-01-25 DIAGNOSIS — R079 Chest pain, unspecified: Secondary | ICD-10-CM

## 2012-01-25 LAB — HEPATIC FUNCTION PANEL
AST: 33 U/L (ref 0–37)
Albumin: 4.3 g/dL (ref 3.5–5.2)

## 2012-01-25 LAB — BASIC METABOLIC PANEL
CO2: 28 mEq/L (ref 19–32)
Chloride: 106 mEq/L (ref 96–112)
GFR: 68.84 mL/min (ref >60.00)
Glucose, Bld: 88 mg/dL (ref 70–99)
Potassium: 4.5 mEq/L (ref 3.5–5.1)
Sodium: 141 mEq/L (ref 135–145)

## 2012-01-25 LAB — LIPID PANEL
Cholesterol: 202 mg/dL — ABNORMAL HIGH (ref 0–200)
VLDL: 14.4 mg/dL (ref 0.0–40.0)

## 2012-01-25 NOTE — Assessment & Plan Note (Addendum)
Rx of cholesterol will be determined by Dx of CAD or not  Calcium score would be helpful to see if statin needed.  Continue fish oil until stress testing done Lab Results  Component Value Date   LDLCALC 74 01/09/2008

## 2012-01-25 NOTE — Patient Instructions (Signed)
Your physician recommends that you schedule a follow-up appointment in:  AS NEEDED  Your physician recommends that you continue on your current medications as directed. Please refer to the Current Medication list given to you today.  Your physician has requested that you have cardiac CT. Cardiac computed tomography (CT) is a painless test that uses an x-ray machine to take clear, detailed pictures of your heart. For further information please visit [email or web address] Please follow instruction sheet as given.  DX CHEST PAIN

## 2012-01-25 NOTE — Assessment & Plan Note (Signed)
Atypical in that it is not always exertional.  Family history and poor lipid profile Unable to walk on treadmill due to chronic right tibial injury.  Options include lexiscan myouve or cardiac CT  Prefer latter as it will pick up earlier disease and help with decision about more aggressive lipid Rx.

## 2012-01-25 NOTE — Progress Notes (Signed)
Patient ID: Keith Schmitt, male   DOB: June 11, 1960, 51 y.o.   MRN: [id number] 51 yo with positive family history of premature CAD.  Father and all his brothers with MI in 60's or earlier and mother with MI in 53's.  SSCP last two months.  Sometimes exertional but can be at rest.  Pressure in center of chest Can last hours.  Occasional more sudden sharp pain.  Been persistant for 2 months.  No previous ETT.  Cannot run on treadmill currently due to chronic LLE tibial stress fracture.  No dyspnea.  No pleuritic component.  No trauma musculoskeletal disease or arthritides.  He is concerned about family history and persistant chest pain.  Reviewed ECG from Dr Caryl Never office 9/24  NSR rate  54 normal with no pericarditis or signs of ischemia.  LDL also quite high at 149 and only on fish oil    ROS: Denies fever, malais, weight loss, blurry vision, decreased visual acuity, cough, sputum, SOB, hemoptysis, pleuritic pain, palpitaitons, heartburn, abdominal pain, melena, lower extremity edema, claudication, or rash.  All other systems reviewed and negative   General: Affect appropriate Healthy:  appears stated age HEENT: normal Neck supple with no adenopathy JVP normal no bruits no thyromegaly Lungs clear with no wheezing and good diaphragmatic motion Heart:  S1/S2 no murmur,rub, gallop or click PMI normal Abdomen: benighn, BS positve, no tenderness, no AAA no bruit.  No HSM or HJR Distal pulses intact with no bruits No edema Neuro non-focal Skin warm and dry No muscular weakness  Medications Current Outpatient Prescriptions  Medication Sig Dispense Refill  . aspirin 81 MG tablet Take 81 mg by mouth daily.      . fish oil-omega-3 fatty acids 1000 MG capsule Take 2 g by mouth daily.      . Multiple Vitamin (MULTIVITAMIN) tablet Take 1 tablet by mouth daily.        Allergies Vytorin  Family History: Family History  Problem Relation Age of Onset  . Heart attack Father 52  . Alcohol abuse  Father   . Heart attack Mother 95  . Alcohol abuse Mother   . Heart attack Paternal Uncle 22  . Cancer Neg Hx   . Diabetes Neg Hx   . Stroke Neg Hx     Social History: History   Social History  . Marital Status: Married    Spouse Name: N/A    Number of Children: N/A  . Years of Education: N/A   Occupational History  . Not on file.   Social History Main Topics  . Smoking status: Never Smoker   . Smokeless tobacco: Never Used  . Alcohol Use: 0.0 oz/week    5-10 Cans of beer per week  . Drug Use: No  . Sexually Active: Not on file   Other Topics Concern  . Not on file   Social History Narrative  . No narrative on file    Electrocardiogram:  SEE HPI\ Assessment and Plan

## 2012-01-30 ENCOUNTER — Telehealth: Payer: Self-pay | Admitting: *Deleted

## 2012-01-30 NOTE — Addendum Note (Signed)
Addended by: Scherrie Bateman E on: 01/30/2012 03:17 PM   Modules accepted: Orders

## 2012-02-14 NOTE — Telephone Encounter (Signed)
Error

## 2012-02-15 ENCOUNTER — Ambulatory Visit (HOSPITAL_COMMUNITY): Payer: 59 | Attending: Cardiology | Admitting: Radiology

## 2012-02-15 VITALS — BP 131/86 | HR 59 | Ht 72.0 in | Wt 206.0 lb

## 2012-02-15 DIAGNOSIS — Z8249 Family history of ischemic heart disease and other diseases of the circulatory system: Secondary | ICD-10-CM | POA: Insufficient documentation

## 2012-02-15 DIAGNOSIS — R0789 Other chest pain: Secondary | ICD-10-CM | POA: Insufficient documentation

## 2012-02-15 DIAGNOSIS — R002 Palpitations: Secondary | ICD-10-CM | POA: Insufficient documentation

## 2012-02-15 DIAGNOSIS — R079 Chest pain, unspecified: Secondary | ICD-10-CM

## 2012-02-15 MED ORDER — REGADENOSON 0.4 MG/5ML IV SOLN
0.4000 mg | Freq: Once | INTRAVENOUS | Status: AC
  Administered 2012-02-15: 0.4 mg via INTRAVENOUS

## 2012-02-15 MED ORDER — TECHNETIUM TC 99M SESTAMIBI GENERIC - CARDIOLITE
33.0000 | Freq: Once | INTRAVENOUS | Status: AC | PRN
  Administered 2012-02-15: 33 via INTRAVENOUS

## 2012-02-15 MED ORDER — TECHNETIUM TC 99M SESTAMIBI GENERIC - CARDIOLITE
11.0000 | Freq: Once | INTRAVENOUS | Status: AC | PRN
  Administered 2012-02-15: 11 via INTRAVENOUS

## 2012-02-15 NOTE — Progress Notes (Signed)
[facility] SITE 3 NUCLEAR MED [id number] [place] [address] [phone]  Cardiology Nuclear Med Study  Keith Schmitt is a 51 y.o. male     MRN : [id number]     DOB: [birth year]  Procedure Date: 02/15/2012  Nuclear Med Background Indication for Stress Test:  Evaluation for Ischemia History:  No previous documented CAD. Cardiac Risk Factors: Very Strong Family History - CAD and Lipids  Symptoms:  Chest Tightness with and without Exertion (last episode of chest discomfort is now, 1/10, it is constant), Fatigue and Palpitations   Nuclear Pre-Procedure Caffeine/Decaff Intake:  None > 12 hrs NPO After: 6:30am   Lungs:  Clear. O2 Sat: 99% on room air. IV 0.9% NS with Angio Cath:  20g  IV Site: R Antecubital x 1, tolerated well IV Started by:  Irean Hong, RN  Chest Size (in):  46 Cup Size: n/a  Height: 6' (1.829 m)  Weight:  206 lb (93.441 kg)  BMI:  Body mass index is 27.94 kg/(m^2). Tech Comments:  n/a    Nuclear Med Study 1 or 2 day study: 1 day  Stress Test Type:  Treadmill/Lexiscan  Reading MD: Cassell Clement, MD  Order Authorizing Provider:  Charlton Haws, MD  Resting Radionuclide: Technetium 76m Sestamibi  Resting Radionuclide Dose: 11.0 mCi   Stress Radionuclide:  Technetium 40m Sestamibi  Stress Radionuclide Dose: 33.0 mCi           Stress Protocol Rest HR: 59 Stress HR: 100  Rest BP: 131/86 Stress BP: 198/77  Exercise Time (min): 2:00 METS: n/a   Predicted Max HR: 168 bpm % Max HR: 59.52 bpm Rate Pressure Product: 28413   Dose of Adenosine (mg):  n/a Dose of Lexiscan: 0.4 mg  Dose of Atropine (mg): n/a Dose of Dobutamine: n/a mcg/kg/min (at max HR)  Stress Test Technologist: Smiley Houseman, CMA-N  Nuclear Technologist:  Domenic Polite, CNMT     Rest Procedure:  Myocardial perfusion imaging was performed at rest 45 minutes following the intravenous administration of Technetium 46m Sestamibi.  Rest ECG: No acute  changes.  Stress Procedure:  The patient received IV Lexiscan 0.4 mg over 15-seconds with concurrent low level exercise and then Technetium 76m Sestamibi was injected at 30-seconds while the patient continued walking one more minute. There were no significant changes with Lexiscan. Quantitative spect images were obtained after a 45-minute delay.  Stress ECG: No significant change from baseline ECG  QPS Raw Data Images:  Normal; no motion artifact; normal heart/lung ratio. Stress Images:  Normal homogeneous uptake in all areas of the myocardium. Rest Images:  Normal homogeneous uptake in all areas of the myocardium. Subtraction (SDS):  No evidence of ischemia. Transient Ischemic Dilatation (Normal <1.22):  1.11 Lung/Heart Ratio (Normal <0.45):  0.30  Quantitative Gated Spect Images QGS EDV:  120 ml QGS ESV:  60 ml  Impression Exercise Capacity:  Lexiscan with low level exercise. BP Response:  Normal blood pressure response. Clinical Symptoms:  No significant symptoms noted. ECG Impression:  No significant ST segment change suggestive of ischemia. Comparison with Prior Nuclear Study: No previous nuclear study performed  Overall Impression:  Normal stress nuclear study.  LV Ejection Fraction: 51%.  LV Wall Motion:  NL LV Function; NL Wall Motion  [employer]

## 2012-07-03 ENCOUNTER — Telehealth: Payer: Self-pay | Admitting: Internal Medicine

## 2012-07-03 NOTE — Telephone Encounter (Signed)
Order placed. Patient will be contacted by referral coordinator once appointment set up

## 2012-07-03 NOTE — Telephone Encounter (Signed)
Pt states he needs referral for colonoscopy. He has never had one before. CB# [phone]

## 2012-07-09 ENCOUNTER — Encounter: Payer: Self-pay | Admitting: Gastroenterology

## 2012-07-13 ENCOUNTER — Encounter: Payer: Self-pay | Admitting: Internal Medicine

## 2012-07-13 ENCOUNTER — Ambulatory Visit (INDEPENDENT_AMBULATORY_CARE_PROVIDER_SITE_OTHER): Payer: 59 | Admitting: Internal Medicine

## 2012-07-13 VITALS — BP 140/90 | HR 67 | Temp 98.1°F | Wt 211.3 lb

## 2012-07-13 DIAGNOSIS — R1032 Left lower quadrant pain: Secondary | ICD-10-CM

## 2012-07-13 LAB — CBC WITH DIFFERENTIAL/PLATELET
Basophils Relative: 1 % (ref 0–1)
Eosinophils Absolute: 0.5 10*3/uL (ref 0.0–0.7)
HCT: 45.6 % (ref 39.0–52.0)
Hemoglobin: 16.3 g/dL (ref 13.0–17.0)
Lymphs Abs: 2.1 10*3/uL (ref 0.7–4.0)
MCH: 30.6 pg (ref 26.0–34.0)
MCHC: 35.7 g/dL (ref 30.0–36.0)
MCV: 85.7 fL (ref 78.0–100.0)
Monocytes Absolute: 0.6 10*3/uL (ref 0.1–1.0)
Monocytes Relative: 9 % (ref 3–12)
RBC: 5.32 MIL/uL (ref 4.22–5.81)

## 2012-07-13 LAB — TSH: TSH: 1.896 u[IU]/mL (ref 0.350–4.500)

## 2012-07-13 NOTE — Patient Instructions (Addendum)
Miralax every 3 rd day as needed for constipation.Stay well hydrated. Drink to thirst up to 40 ounces of fluids daily. Please complete and return stool cards; these will determine whether there is any gastrointestinal bleeding risk.

## 2012-07-13 NOTE — Progress Notes (Signed)
  Subjective:    Patient ID: Keith Schmitt, male    DOB: Mar 12, 1961, 52 y.o.   MRN: [id number]  HPI Abdominal pain began November in context of constipation & has recurred monthly since, twice this month.Pain recurs in LLQ  ; it was described as  sharp and non radiating . Severity was up to a level 6 ; the discomfort lasts days even up to for up to 3 days after bowel movements start . It is impacted by prolonged sitting . Self treatment last week with Fleets pills helped constipation . There were no definite exacerbating factors There was  some bleeding post laxative use.   Past medical history and family history are negative   for significant gastrointestinal diseases such as ulcer, reflux,  colitis,  colon polyps, or colon cancer. No colonoscopy to date.         Review of Systems Nausea vomiting,  diarrhea,  & melena  were not described.There was no associated dyspepsia ,dysphagia, anorexia or hematemesis. Fever,chills,  sweats, and weight change were not present. Dysuria, pyuria, and hematuria were absent. There was no associated rash or radicular pain in the area of the discomfort.     Objective:   Physical Exam General appearance is one of good health and nourishment w/o distress. Eyes: No conjunctival inflammation or scleral icterus is present. Oral exam: Dental hygiene is good; lips and gums are healthy appearing.There is no oropharyngeal erythema or exudate noted.  Heart:  Normal rate and regular rhythm. S1 and S2 normal without gallop, murmur, click, rub or other extra sounds . Lungs:Chest clear to auscultation; no wheezes, rhonchi,rales ,or rubs present.No increased work of breathing.  Abdomen: bowel sounds normal, soft but slightly tender both lower quadrants without masses, organomegaly or hernias noted.  No guarding or rebound. Deep tendon reflexes, strength, and tone in lower extremities are normal. Negative straight leg raising  Genitourinary: Testicles are normal with  no associated lesions.  Skin:Warm & dry.  Intact without suspicious lesions or rashes ; no jaundice or tenting. Lymphatic: No lymphadenopathy is noted about the head, neck, axilla, or inguinal areas.         Assessment & Plan:  #1 abdominal discomfort, mainly left lower quadrant in the context of #2  #2 constipation  Plan: He'll be asked to take MiraLax every third day if he is having constipation.

## 2012-07-13 NOTE — Addendum Note (Signed)
Addended by: Silvio Pate D on: 07/13/2012 04:30 PM   Modules accepted: Orders

## 2012-08-06 ENCOUNTER — Ambulatory Visit (AMBULATORY_SURGERY_CENTER): Payer: 59 | Admitting: *Deleted

## 2012-08-06 ENCOUNTER — Encounter: Payer: Self-pay | Admitting: Gastroenterology

## 2012-08-06 VITALS — Ht 72.0 in | Wt 207.0 lb

## 2012-08-06 DIAGNOSIS — Z1211 Encounter for screening for malignant neoplasm of colon: Secondary | ICD-10-CM

## 2012-08-06 MED ORDER — MOVIPREP 100 G PO SOLR: ORAL | Status: DC

## 2012-08-10 ENCOUNTER — Other Ambulatory Visit (INDEPENDENT_AMBULATORY_CARE_PROVIDER_SITE_OTHER): Payer: 59

## 2012-08-10 DIAGNOSIS — Z1289 Encounter for screening for malignant neoplasm of other sites: Secondary | ICD-10-CM

## 2012-08-10 DIAGNOSIS — Z0000 Encounter for general adult medical examination without abnormal findings: Secondary | ICD-10-CM

## 2012-08-10 LAB — HEMOCCULT GUIAC POC 1CARD (OFFICE): Card #2 Fecal Occult Blod, POC: NEGATIVE

## 2012-08-20 ENCOUNTER — Ambulatory Visit (AMBULATORY_SURGERY_CENTER): Payer: 59 | Admitting: Gastroenterology

## 2012-08-20 ENCOUNTER — Encounter: Payer: Self-pay | Admitting: Gastroenterology

## 2012-08-20 VITALS — BP 112/76 | HR 48 | Temp 97.3°F | Resp 14 | Ht 72.0 in | Wt 207.0 lb

## 2012-08-20 DIAGNOSIS — Z1211 Encounter for screening for malignant neoplasm of colon: Secondary | ICD-10-CM

## 2012-08-20 DIAGNOSIS — K5730 Diverticulosis of large intestine without perforation or abscess without bleeding: Secondary | ICD-10-CM

## 2012-08-20 DIAGNOSIS — D126 Benign neoplasm of colon, unspecified: Secondary | ICD-10-CM

## 2012-08-20 MED ORDER — SODIUM CHLORIDE 0.9 % IV SOLN: 500.0000 mL | INTRAVENOUS | Status: DC

## 2012-08-20 MED ORDER — HYOSCYAMINE SULFATE 0.125 MG SL SUBL: 0.1250 mg | SUBLINGUAL_TABLET | Freq: Four times a day (QID) | SUBLINGUAL | Status: DC | PRN

## 2012-08-20 NOTE — Patient Instructions (Addendum)
YOU HAD AN ENDOSCOPIC PROCEDURE TODAY AT THE [facility]: Refer to the procedure report that was given to you for any specific questions about what was found during the examination.  If the procedure report does not answer your questions, please call your gastroenterologist to clarify.  If you requested that your care partner not be given the details of your procedure findings, then the procedure report has been included in a sealed envelope for you to review at your convenience later.  YOU SHOULD EXPECT: Some feelings of bloating in the abdomen. Passage of more gas than usual.  Walking can help get rid of the air that was put into your GI tract during the procedure and reduce the bloating. If you had a lower endoscopy (such as a colonoscopy or flexible sigmoidoscopy) you may notice spotting of blood in your stool or on the toilet paper. If you underwent a bowel prep for your procedure, then you may not have a normal bowel movement for a few days.  DIET: Your first meal following the procedure should be a light meal and then it is ok to progress to your normal diet.  A half-sandwich or bowl of soup is an example of a good first meal.  Heavy or fried foods are harder to digest and may make you feel nauseous or bloated.  Likewise meals heavy in dairy and vegetables can cause extra gas to form and this can also increase the bloating.  Drink plenty of fluids but you should avoid alcoholic beverages for 24 hours.  ACTIVITY: Your care partner should take you home directly after the procedure.  You should plan to take it easy, moving slowly for the rest of the day.  You can resume normal activity the day after the procedure however you should NOT DRIVE or use heavy machinery for 24 hours (because of the sedation medicines used during the test).    SYMPTOMS TO REPORT IMMEDIATELY: A gastroenterologist can be reached at any hour.  During normal business hours, 8:30 AM to 5:00 PM Monday through Friday,  call [phone].  After hours and on weekends, please call the GI answering service at [phone] who will take a message and have the physician on call contact you.   Following lower endoscopy (colonoscopy or flexible sigmoidoscopy):  Excessive amounts of blood in the stool  Significant tenderness or worsening of abdominal pains  Swelling of the abdomen that is new, acute  Fever of 100F or higher    FOLLOW UP: If any biopsies were taken you will be contacted by phone or by letter within the next 1-3 weeks.  Call your gastroenterologist if you have not heard about the biopsies in 3 weeks.  Our staff will call the home number listed on your records the next business day following your procedure to check on you and address any questions or concerns that you may have at that time regarding the information given to you following your procedure. This is a courtesy call and so if there is no answer at the home number and we have not heard from you through the emergency physician on call, we will assume that you have returned to your regular daily activities without incident.  SIGNATURES/CONFIDENTIALITY: You and/or your care partner have signed paperwork which will be entered into your electronic medical record.  These signatures attest to the fact that that the information above on your After Visit Summary has been reviewed and is understood.  Full responsibility of the confidentiality   of this discharge information lies with you and/or your care-partner.    Information on polyps,diverticulosis,&high fiber diet given to you today  levsin ,medication for abd cramping sent to [facility] pharmacy for you to pick up   Metamucil or benefiber daily as directed   F/u appoint with Dr Jarold Motto 9:45 am

## 2012-08-20 NOTE — Progress Notes (Signed)
Called to room to assist during endoscopic procedure.  Patient ID and intended procedure confirmed with present staff. Received instructions for my participation in the procedure from the performing physician.  

## 2012-08-20 NOTE — Op Note (Signed)
[facility] [address] [place], [address]   COLONOSCOPY PROCEDURE REPORT  PATIENT: Keith, Schmitt  MR#: [id number] BIRTHDATE: [birth year] , 51  yrs. old GENDER: Male ENDOSCOPIST: [name], MD, [facility] REFERRED BY:  Marga Melnick, M.D. PROCEDURE DATE:  08/20/2012 PROCEDURE:   Colonoscopy with snare polypectomy ASA CLASS:   Class I INDICATIONS:Average risk patient for colon cancer. MEDICATIONS: Propofol (Diprivan) 240 mg IV  DESCRIPTION OF PROCEDURE:   After the risks and benefits and of the procedure were explained, informed consent was obtained.  A digital rectal exam revealed no abnormalities of the rectum.    The LB CF-[id number]  endoscope was introduced through the anus and advanced to the cecum, which was identified by both the appendix and ileocecal valve .  The quality of the prep was excellent, using MoviPrep .  The instrument was then slowly withdrawn as the colon was fully examined.     COLON FINDINGS: There was severe diverticulosis noted in the descending colon and sigmoid colon with associated muscular hypertrophy and colonic spasm.  No bleeding was noted from the diverticulosis.   Two smooth flat polyps ranging between 3-42mm in size were found in the descending colon.  A polypectomy was performed with a cold snare.  The resection was complete and the polyp tissue was completely retrieved.     Retroflexed views revealed no abnormalities.     The scope was then withdrawn from the patient and the procedure completed.  COMPLICATIONS: There were no complications. ENDOSCOPIC IMPRESSION: 1.   There was severe diverticulosis noted in the descending colon and sigmoid colon 2.   Two flat polyps ranging between 3-72mm in size were found in the descending colon; polypectomy was performed with a cold snare .Marland KitchenMarland Kitchenprobable sdenomas.  RECOMMENDATIONS: 1.  Await pathology results 2.  Repeat colonoscopy in 5 years if polyp adenomatous;  otherwise 10 years 3.  High fiber diet 4.  Metamucil or benefiber   REPEAT EXAM:  cc:  _______________________________ eSignedMardella Layman, MD, [facility] 08/20/2012 9:24 AM     PATIENT NAME:  Keith, Schmitt MR#: [id number]

## 2012-08-20 NOTE — Progress Notes (Signed)
No allergies to eggs or soy per pt. 

## 2012-08-20 NOTE — Progress Notes (Signed)
Patient did not experience any of the following events: a burn prior to discharge; a fall within the facility; wrong site/side/patient/procedure/implant event; or a hospital transfer or hospital admission upon discharge from the facility. (G8907) Patient did not have preoperative order for IV antibiotic SSI prophylaxis. (G8918)  

## 2012-08-21 ENCOUNTER — Telehealth: Payer: Self-pay | Admitting: *Deleted

## 2012-08-21 NOTE — Telephone Encounter (Signed)
  Follow up Call-  Call back number 08/20/2012  Post procedure Call Back phone  # [phone]  Permission to leave phone message Yes     Patient questions:  Message left to call us if necessary.

## 2012-08-24 ENCOUNTER — Encounter: Payer: Self-pay | Admitting: Gastroenterology

## 2012-09-14 ENCOUNTER — Encounter: Payer: Self-pay | Admitting: *Deleted

## 2012-09-20 ENCOUNTER — Encounter: Payer: Self-pay | Admitting: Gastroenterology

## 2012-09-20 ENCOUNTER — Ambulatory Visit (INDEPENDENT_AMBULATORY_CARE_PROVIDER_SITE_OTHER): Payer: 59 | Admitting: Gastroenterology

## 2012-09-20 VITALS — BP 122/80 | HR 51 | Ht 72.0 in | Wt 212.0 lb

## 2012-09-20 DIAGNOSIS — Z86010 Personal history of colonic polyps: Secondary | ICD-10-CM

## 2012-09-20 DIAGNOSIS — K5730 Diverticulosis of large intestine without perforation or abscess without bleeding: Secondary | ICD-10-CM

## 2012-09-20 DIAGNOSIS — R1032 Left lower quadrant pain: Secondary | ICD-10-CM

## 2012-09-20 DIAGNOSIS — K5900 Constipation, unspecified: Secondary | ICD-10-CM

## 2012-09-20 NOTE — Progress Notes (Signed)
History of Present Illness:  This is a 52 year old Caucasian male he recently underwent screening colonoscopy.  He had 2 adenomatous polyps excised, but also was noted to have severe diverticulosis of the rectosigmoid area, was scheduled for office visit as a new GI patient.  The patient describes a one year history of crampy and colicky left lower quadrant discomfort with increasing constipation.  He has added fiber to his diet,and he seems to be doing better with his constipation, he has noticed intolerance to nuts in his diet.  He has not had actual diagnosis of diverticulitis previously, and denies melena or hematochezia..  The patient denies upper GI or hepatobiliary or other general medical problems.  He does take daily aspirin, and when necessary Advil for musculoskeletal pain.  His appetite is good and his weight is stable.  There is no history of hepatobiliary, gallbladder, liver problems.  I have reviewed this patient's present history, medical and surgical past history, allergies and medications.     ROS:   All systems were reviewed and are negative unless otherwise stated in the HPI.    Physical Exam: Blood pressure 122/80, pulse 51 and regular, and weight 212 with BMI of 28.75. General well developed well nourished patient in no acute distress, appearing their stated age Eyes PERRLA, no icterus, fundoscopic exam per opthamologist Skin no lesions noted Neck supple, no adenopathy, no thyroid enlargement, no tenderness Chest clear to percussion and auscultation Heart no significant murmurs, gallops or rubs noted Abdomen no hepatosplenomegaly masses or tenderness, BS normal.  Extremities no acute joint lesions, edema, phlebitis or evidence of cellulitis. Neurologic patient oriented x 3, cranial nerves intact, no focal neurologic deficits noted. Psychological mental status normal and normal affect.  Assessment and plan: This patient is relatively young for colonic polyposis, and he needs  close colonoscopy followup in 5 years time.  He also has severe diverticulosis at a young age, and I have switched him to Benefiber 1 tablespoon twice a day with liberal by mouth fluids and high-fiber diet.  He saw a patient video concerning diverticulosis and its management, and we have given him printed information concerning management of this condition.  He does have Levsin to use when necessary sublingually for colicky left lower quadrant pain.  He is to see [place] on a when necessary basis as needed.  He otherwise appears to be in good health and is followed by Dr. Marga Melnick.  No diagnosis found.

## 2012-09-20 NOTE — Patient Instructions (Addendum)
Information on Diverticulosis was given today and information is below. You watched a video today on Diverticulosis. Please start high fiber diet and information is below. Please purchase Metamucil over the counter. Take twice daily. Please follow up with Dr. Jarold Motto in one year or sooner if symptoms reoccur. __________________________________________________________________________________________________________________________________________  Diverticulosis Diverticulosis is a common condition that develops when small pouches (diverticula) form in the wall of the colon. The risk of diverticulosis increases with age. It happens more often in people who eat a low-fiber diet. Most individuals with diverticulosis have no symptoms. Those individuals with symptoms usually experience abdominal pain, constipation, or loose stools (diarrhea). HOME CARE INSTRUCTIONS   Increase the amount of fiber in your diet as directed by your caregiver or dietician. This may reduce symptoms of diverticulosis.  Your caregiver may recommend taking a dietary fiber supplement.  Drink at least 6 to 8 glasses of water each day to prevent constipation.  Try not to strain when you have a bowel movement.  Your caregiver may recommend avoiding nuts and seeds to prevent complications, although this is still an uncertain benefit.  Only take over-the-counter or prescription medicines for pain, discomfort, or fever as directed by your caregiver. FOODS WITH HIGH FIBER CONTENT INCLUDE:  Fruits. Apple, peach, pear, tangerine, raisins, prunes.  Vegetables. Brussels sprouts, asparagus, broccoli, cabbage, carrot, cauliflower, romaine lettuce, spinach, summer squash, tomato, winter squash, zucchini.  Starchy Vegetables. Baked beans, kidney beans, lima beans, split peas, lentils, potatoes (with skin).  Grains. Whole wheat bread, brown rice, bran flake cereal, plain oatmeal, white rice, shredded wheat, bran muffins. SEEK  IMMEDIATE MEDICAL CARE IF:   You develop increasing pain or severe bloating.  You have an oral temperature above 102 F (38.9 C), not controlled by medicine.  You develop vomiting or bowel movements that are bloody or black. Document Released: 01/14/2004 Document Revised: 07/11/2011 Document Reviewed: 09/16/2009 ExitCare Patient Information 2014 [employer], [place]. _________________________________________________________________________________________________________  High-Fiber Diet Fiber is found in fruits, vegetables, and grains. A high-fiber diet encourages the addition of more whole grains, legumes, fruits, and vegetables in your diet. The recommended amount of fiber for adult males is 38 g per day. For adult females, it is 25 g per day. Pregnant and lactating women should get 28 g of fiber per day. If you have a digestive or bowel problem, ask your caregiver for advice before adding high-fiber foods to your diet. Eat a variety of high-fiber foods instead of only a select few type of foods.  PURPOSE  To increase stool bulk.  To make bowel movements more regular to prevent constipation.  To lower cholesterol.  To prevent overeating. WHEN IS THIS DIET USED?  It may be used if you have constipation and hemorrhoids.  It may be used if you have uncomplicated diverticulosis (intestine condition) and irritable bowel syndrome.  It may be used if you need help with weight management.  It may be used if you want to add it to your diet as a protective measure against atherosclerosis, diabetes, and cancer. SOURCES OF FIBER  Whole-grain breads and cereals.  Fruits, such as apples, oranges, bananas, berries, prunes, and pears.  Vegetables, such as green peas, carrots, sweet potatoes, beets, broccoli, cabbage, spinach, and artichokes.  Legumes, such split peas, soy, lentils.  Almonds. FIBER CONTENT IN FOODS Starches and Grains / Dietary Fiber (g)  Cheerios, 1 cup / 3 g  Corn  Flakes cereal, 1 cup / 0.7 g  Rice crispy treat cereal, 1 cup / 0.3 g  Instant oatmeal (cooked),  cup / 2 g  Frosted wheat cereal, 1 cup / 5.1 g  Brown, long-grain rice (cooked), 1 cup / 3.5 g  White, long-grain rice (cooked), 1 cup / 0.6 g  Enriched macaroni (cooked), 1 cup / 2.5 g Legumes / Dietary Fiber (g)  Baked beans (canned, plain, or vegetarian),  cup / 5.2 g  Kidney beans (canned),  cup / 6.8 g  Pinto beans (cooked),  cup / 5.5 g Breads and Crackers / Dietary Fiber (g)  Plain or honey graham crackers, 2 squares / 0.7 g  Saltine crackers, 3 squares / 0.3 g  Plain, salted pretzels, 10 pieces / 1.8 g  Whole-wheat bread, 1 slice / 1.9 g  White bread, 1 slice / 0.7 g  Raisin bread, 1 slice / 1.2 g  Plain bagel, 3 oz / 2 g  Flour tortilla, 1 oz / 0.9 g  Corn tortilla, 1 small / 1.5 g  Hamburger or hotdog bun, 1 small / 0.9 g Fruits / Dietary Fiber (g)  Apple with skin, 1 medium / 4.4 g  Sweetened applesauce,  cup / 1.5 g  Banana,  medium / 1.5 g  Grapes, 10 grapes / 0.4 g  Orange, 1 small / 2.3 g  Raisin, 1.5 oz / 1.6 g  Melon, 1 cup / 1.4 g Vegetables / Dietary Fiber (g)  Green beans (canned),  cup / 1.3 g  Carrots (cooked),  cup / 2.3 g  Broccoli (cooked),  cup / 2.8 g  Peas (cooked),  cup / 4.4 g  Mashed potatoes,  cup / 1.6 g  Lettuce, 1 cup / 0.5 g  Corn (canned),  cup / 1.6 g  Tomato,  cup / 1.1 g Document Released: 04/18/2005 Document Revised: 10/18/2011 Document Reviewed: 07/21/2011 [facility] Patient Information 2014 [place], [place].

## 2012-09-25 ENCOUNTER — Ambulatory Visit (INDEPENDENT_AMBULATORY_CARE_PROVIDER_SITE_OTHER): Payer: 59 | Admitting: Gastroenterology

## 2012-09-25 ENCOUNTER — Other Ambulatory Visit (INDEPENDENT_AMBULATORY_CARE_PROVIDER_SITE_OTHER): Payer: 59

## 2012-09-25 ENCOUNTER — Telehealth: Payer: Self-pay | Admitting: Gastroenterology

## 2012-09-25 ENCOUNTER — Encounter: Payer: Self-pay | Admitting: Gastroenterology

## 2012-09-25 VITALS — BP 124/80 | HR 76 | Temp 97.7°F | Ht 72.0 in | Wt 210.0 lb

## 2012-09-25 DIAGNOSIS — R1032 Left lower quadrant pain: Secondary | ICD-10-CM

## 2012-09-25 DIAGNOSIS — K5730 Diverticulosis of large intestine without perforation or abscess without bleeding: Secondary | ICD-10-CM

## 2012-09-25 LAB — SEDIMENTATION RATE: Sed Rate: 34 mm/hr — ABNORMAL HIGH (ref 0–22)

## 2012-09-25 LAB — CBC WITH DIFFERENTIAL/PLATELET
Basophils Absolute: 0 10*3/uL (ref 0.0–0.1)
Eosinophils Absolute: 0.2 10*3/uL (ref 0.0–0.7)
Lymphocytes Relative: 12.7 % (ref 12.0–46.0)
MCHC: 35.1 g/dL (ref 30.0–36.0)
MCV: 90.6 fl (ref 78.0–100.0)
Monocytes Absolute: 1.6 10*3/uL — ABNORMAL HIGH (ref 0.1–1.0)
Neutrophils Relative %: 75 % (ref 43.0–77.0)
Platelets: 230 10*3/uL (ref 150.0–400.0)
RDW: 12.9 % (ref 11.5–14.6)

## 2012-09-25 MED ORDER — METRONIDAZOLE 500 MG PO TABS: ORAL_TABLET | ORAL | Status: DC

## 2012-09-25 MED ORDER — CIPROFLOXACIN HCL 500 MG PO TABS: ORAL_TABLET | ORAL | Status: DC

## 2012-09-25 MED ORDER — TRAMADOL HCL 50 MG PO TABS: 50.0000 mg | ORAL_TABLET | Freq: Four times a day (QID) | ORAL | Status: DC | PRN

## 2012-09-25 NOTE — Telephone Encounter (Signed)
Needs ov me or PA.Marland KitchenASAP

## 2012-09-25 NOTE — Telephone Encounter (Signed)
Pt seen 08/20/12 with adenomatous polyp and sever diverticulosis. He had f/u on 09/20/12. Pt reports severe abdominal pain in LLQ over the weekend with chills and a temp- he did not take his temp. States Levsin did not help much with the pain. Please advise; meds or OV. Thanks.

## 2012-09-25 NOTE — Telephone Encounter (Signed)
Informed pt he needs to  Be seen; he agreed and will see Dr Jarold Motto at 3pm today.

## 2012-09-25 NOTE — Patient Instructions (Addendum)
We have sent the following medications to your pharmacy for you to pick up at your convenience: Cipro 500 mg, please take one tablet by mouth twice daily for a week. Flagyl 500 mg, please take one tablet by mouth twice daily for a week. Tramadol 50 mg, please take as needed for pain.  Your physician has requested that you go to the basement for the following lab work before leaving today: CBC and Sedimentation Rate. _________________________________________________________________________________________________________________________________  Keith Schmitt have been scheduled for a CT scan of the abdomen and pelvis at [facility] ([address]---this is in the same building as Architectural technologist).   You are scheduled on 09-26-12 at 1 pm. You should arrive 15 minutes prior to your appointment time for registration. Please follow the written instructions below on the day of your exam:  WARNING: IF YOU ARE ALLERGIC TO IODINE/X-RAY DYE, PLEASE NOTIFY RADIOLOGY IMMEDIATELY AT [phone]! YOU WILL BE GIVEN A 13 HOUR PREMEDICATION PREP.  1) Do not eat or drink anything after 9am (4 hours prior to your test) 2) You have been given 2 bottles of oral contrast to drink. The solution may taste better if refrigerated, but do NOT add ice or any other liquid to this solution. Shake well before drinking.    Drink 1 bottle of contrast @ 11am (2 hours prior to your exam)  Drink 1 bottle of contrast @ 12pm (1 hour prior to your exam)  You may take any medications as prescribed with a small amount of water except for the following: Metformin, Glucophage, Glucovance, Avandamet, Riomet, Fortamet, Actoplus Met, Janumet, Glumetza or Metaglip. The above medications must be held the day of the exam AND 48 hours after the exam.  The purpose of you drinking the oral contrast is to aid in the visualization of your intestinal tract. The contrast solution may cause some diarrhea. Before your exam is started, you  will be given a small amount of fluid to drink. Depending on your individual set of symptoms, you may also receive an intravenous injection of x-ray contrast/dye. Plan on being at [facility] for 30 minutes or long, depending on the type of exam you are having performed.  This test typically takes 30-45 minutes to complete.  If you have any questions regarding your exam or if you need to reschedule, you may call the CT department at [phone] between the hours of 8:00 am and 5:00 pm, Monday-Friday.  ________________________________________________________________________

## 2012-09-25 NOTE — Progress Notes (Signed)
This is a 52 year old Caucasian male who underwent screening colonoscopy on April 21 of this year.  I saw him back in the office last week to explain his severe diverticulosis and review 2 small adenomatous polyps  which were excised.  The next day he says he developed severe left lower quadrant pain which has persisted over the last 4-5 days with associated fever and some diarrhea.  Now with more" he relates he has had this pain on and off for several years and is frustrated by his recurrence.  This is certainly not a history of received her last visit.  He denies genitourinary or systemic complaints.  Current Medications, Allergies, Past Medical History, Past Surgical History, Family History and Social History were reviewed in [employer] record.  ROS: All systems were reviewed and are negative unless otherwise stated in the HPI.          Physical Exam: Nontoxic healthy-appearing patient in no acute distress.  Blood pressure 124/80, pulse 76 and regular and weight 210 pounds the BMI of 28.47.  His physical exam is unremarkable except some tenderness over sigmoid colon the left lower quadrant without rebound or any definite masses.  Bowel sounds are present.  Mental status is normal.    Assessment and Plan: Symptomatic diverticulosis versus subacute diverticulitis.  I've scheduled this patient for CT scan of his abdomen and pelvis, we will check CBC and sedimentation rate, and I've started Cipro 500 mg twice a day and metronidazole 500 mg twice a day for the next week.  Because of his complaints, young age, and persistent nature of his problems, he may need to consider sigmoid resection sooner than later. Encounter Diagnosis  Name Primary?  . Diverticulosis of colon without hemorrhage Yes

## 2012-09-26 ENCOUNTER — Ambulatory Visit (INDEPENDENT_AMBULATORY_CARE_PROVIDER_SITE_OTHER)
Admission: RE | Admit: 2012-09-26 | Discharge: 2012-09-26 | Disposition: A | Discharge status: Home / Self Care | Payer: 59 | Source: Ambulatory Visit | Attending: Gastroenterology | Admitting: Gastroenterology

## 2012-09-26 ENCOUNTER — Other Ambulatory Visit: Payer: Self-pay | Admitting: *Deleted

## 2012-09-26 DIAGNOSIS — K5730 Diverticulosis of large intestine without perforation or abscess without bleeding: Secondary | ICD-10-CM

## 2012-09-26 MED ORDER — IOHEXOL 300 MG/ML  SOLN
100.0000 mL | Freq: Once | INTRAMUSCULAR | Status: AC | PRN
  Administered 2012-09-26: 100 mL via INTRAVENOUS

## 2012-09-26 MED ORDER — CIPROFLOXACIN HCL 500 MG PO TABS: 500.0000 mg | ORAL_TABLET | Freq: Two times a day (BID) | ORAL | Status: DC

## 2012-09-26 MED ORDER — METRONIDAZOLE 500 MG PO TABS: 500.0000 mg | ORAL_TABLET | Freq: Two times a day (BID) | ORAL | Status: DC

## 2012-10-10 ENCOUNTER — Encounter: Payer: Self-pay | Admitting: *Deleted

## 2012-10-16 ENCOUNTER — Encounter: Payer: Self-pay | Admitting: Gastroenterology

## 2012-10-16 ENCOUNTER — Ambulatory Visit (INDEPENDENT_AMBULATORY_CARE_PROVIDER_SITE_OTHER): Payer: 59 | Admitting: Gastroenterology

## 2012-10-16 VITALS — BP 116/78 | HR 76 | Ht 70.75 in | Wt 201.5 lb

## 2012-10-16 DIAGNOSIS — K5730 Diverticulosis of large intestine without perforation or abscess without bleeding: Secondary | ICD-10-CM

## 2012-10-16 DIAGNOSIS — Z86010 Personal history of colonic polyps: Secondary | ICD-10-CM

## 2012-10-16 DIAGNOSIS — K5732 Diverticulitis of large intestine without perforation or abscess without bleeding: Secondary | ICD-10-CM

## 2012-10-16 DIAGNOSIS — R1032 Left lower quadrant pain: Secondary | ICD-10-CM

## 2012-10-16 MED ORDER — CIPROFLOXACIN HCL 500 MG PO TABS: 500.0000 mg | ORAL_TABLET | Freq: Two times a day (BID) | ORAL | Status: DC

## 2012-10-16 MED ORDER — MESALAMINE 1.2 G PO TBEC: DELAYED_RELEASE_TABLET | ORAL | Status: DC

## 2012-10-16 NOTE — Patient Instructions (Addendum)
  We have sent the following medications to your pharmacy for you to pick up at your convenience: Cipro 500 mg, please take one tablet by mouth twice daily  We have given you samples of the following medication to take: Lialda, please take two tablets by mouth twice daily  Please follow up with Dr. Jarold Motto in two weeks. ________________________________________________________________________________________________                                               We are excited to introduce MyChart, a new best-in-class service that provides you online access to important information in your electronic medical record. We want to make it easier for you to view your health information - all in one secure location - when and where you need it. We expect MyChart will enhance the quality of care and service we provide.  When you register for MyChart, you can:    View your test results.    Request appointments and receive appointment reminders via email.    Request medication renewals.    View your medical history, allergies, medications and immunizations.    Communicate with your physician's office through a password-protected site.    Conveniently print information such as your medication lists.  To find out if MyChart is right for you, please talk to a member of our clinical staff today. We will gladly answer your questions about this free health and wellness tool.  If you are age 53 or older and want a member of your family to have access to your record, you must provide written consent by completing a proxy form available at our office. Please speak to our clinical staff about guidelines regarding accounts for patients younger than age 48.  As you activate your MyChart account and need any technical assistance, please call the MyChart technical support line at [phone] 6310934502) or email your question to [email or web address]. If you email your question(s), please include  your name, a return phone number and the best time to reach you.  If you have non-urgent health-related questions, you can send a message to our office through MyChart at [email or web address]. If you have a medical emergency, call 911.  Thank you for using MyChart as your new health and wellness resource!   MyChart licensed from [employer],  4540-9811. Patents Pending.

## 2012-10-16 NOTE — Progress Notes (Signed)
This is a 52 year old Caucasian male who underwent screening colonoscopy in  April 21 with removal of 2 polyps, and he was noted to have severe diverticulosis in his left colon.Keith Schmitt  He was seen in the office on 5/22 to talk about his diverticulosis, and was placed on a high fiber diet.  On 5/27 he reported lower abdominal pain and discomfort and underwent CT scanning which showed an area of diverticulitis in the descending colon.  He was treated with 2 weeks of Cipro and metronidazole with resolution of his problems, but now claims that he is starting to have some discomfort in his left lower quadrant again despite high fiber foods, smaller food portions, and a voluntary 10 pound weight loss.  He's had no fever, chills, or genitourinary symptoms.  He does take when necessary Advil for pain.  He had a sedimentation rate of 34 and a slightly elevated white count of 14,700   previously with his acute attack  .He comes in today and is fairly asymptomatic except for some vague discomfort in the left lower quadrant with movement.  He is having regular bowel movements without melena or hematochezia.  Patient is concerned that he may be having a flare of his previous diverticulitis and has many questions concerning etiology and treatment.  Current Medications, Allergies, Past Medical History, Past Surgical History, Family History and Social History were reviewed in [employer] record.  ROS: All systems were reviewed and are negative unless otherwise stated in the HPI.          Physical Exam: Healthy appearing patient in no distress.  Blood pressure 116/78, pulse 76 and weight 201 with a BMI of 28.3.  Examination the abdomen shows no organomegaly, masses, tenderness, or distention.  There is some tenderness to deep palpation of the sigmoid colon but no rebound or masses.  Mental status is normal     Assessment and Plan: Continued low-grade irritation in the area of treated diverticulitis  in his sigmoid colon with extensive diverticulosis noted on prior colonoscopy.  I will give him one more week of Cipro 500 mg twice a day and I have added Lialda 2.4 g twice a day with when necessary sublingual Levsin.  He is to see me back in 2 weeks' time.  He may need followup CT scan and possible movement towards sigmoid resection. No diagnosis found.

## 2012-11-06 ENCOUNTER — Ambulatory Visit (INDEPENDENT_AMBULATORY_CARE_PROVIDER_SITE_OTHER): Payer: 59 | Admitting: Gastroenterology

## 2012-11-06 ENCOUNTER — Encounter: Payer: Self-pay | Admitting: Gastroenterology

## 2012-11-06 ENCOUNTER — Other Ambulatory Visit (INDEPENDENT_AMBULATORY_CARE_PROVIDER_SITE_OTHER): Payer: 59

## 2012-11-06 VITALS — BP 116/80 | HR 72 | Ht 70.75 in | Wt 200.4 lb

## 2012-11-06 DIAGNOSIS — Z860100 Personal history of colon polyps, unspecified: Secondary | ICD-10-CM

## 2012-11-06 DIAGNOSIS — R1032 Left lower quadrant pain: Secondary | ICD-10-CM

## 2012-11-06 DIAGNOSIS — K5732 Diverticulitis of large intestine without perforation or abscess without bleeding: Secondary | ICD-10-CM

## 2012-11-06 LAB — CBC WITH DIFFERENTIAL/PLATELET
Basophils Absolute: 0 10*3/uL (ref 0.0–0.1)
Eosinophils Absolute: 0.6 10*3/uL (ref 0.0–0.7)
HCT: 44 % (ref 39.0–52.0)
Hemoglobin: 15 g/dL (ref 13.0–17.0)
Lymphs Abs: 2 10*3/uL (ref 0.7–4.0)
MCHC: 34.2 g/dL (ref 30.0–36.0)
Monocytes Relative: 8.1 % (ref 3.0–12.0)
Neutro Abs: 4.8 10*3/uL (ref 1.4–7.7)
Platelets: 224 10*3/uL (ref 150.0–400.0)
RDW: 12.7 % (ref 11.5–14.6)

## 2012-11-06 MED ORDER — MESALAMINE 1.2 G PO TBEC: DELAYED_RELEASE_TABLET | ORAL | Status: DC

## 2012-11-06 MED ORDER — TRAMADOL HCL 50 MG PO TABS: 50.0000 mg | ORAL_TABLET | Freq: Four times a day (QID) | ORAL | Status: DC | PRN

## 2012-11-06 NOTE — Progress Notes (Signed)
This is a 52 year old Caucasian male who has had persistent left lower quadrant discomfort despite treatment with antibiotics for suspected diverticulitis in mid May of this past year.  He took amino salicylates for approximately 10 days, but did not continue this medication.  He is on a high-fiber diet with fiber supplements, and is doing fairly well but continues to have a nagging discomfort in his left lower quadrant with some musculoskeletal components.  He is having regular bowel movements without melena or hematochezia.  Last CT scan was 09/25/12.  There is no history of recent fever, chills, melena or hematochezia.  Lab data reviewed and is unremarkable except for slight leukocytosis and sedimentation rate of 34.  Current Medications, Allergies, Past Medical History, Past Surgical History, Family History and Social History were reviewed in [employer] record.  ROS: All systems were reviewed and are negative unless otherwise stated in the HPI.          Physical Exam: Blood pressure 116/80, pulse 72 and regular weight 200 pounds with a BMI of 28.15.  Healthy-appearing patient in no distress.  I cannot appreciate abdominal distention, organomegaly, or significant tenderness.  Bowel sounds are nonobstructive in nature.  Mental status is normal.    Assessment and Plan: Continued left lower quadrant discomfort in a patient with severe diverticulosis noted on previous colonoscopy exam.  His had symptomatic diverticulosis for over a year, and is recently been treated for acute exacerbation and acute diverticulitis after his colonoscopy exam.  Has been no evidence of perforation or abscess formation.  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  We'll repeat his CBC and sedimentation rate, and I placed him on Lialda 2.4 g a day with when necessary tramadol 50 mg every 6-8 hours.  He may need surgical evaluation for possible sigmoid  resection.  We again reviewed diverticulosis, and is management and its complications in detail.  His continue a high-fiber diet with daily Benefiber and liberal by mouth fluids.  Please copy this note to Dr. Marga Melnick Encounter Diagnosis  Name Primary?  . Diverticulitis of colon (without mention of hemorrhage) Yes

## 2012-11-06 NOTE — Patient Instructions (Addendum)
  Please follow up in one month with Dr. Jarold Motto  We have sent the following medications to your pharmacy for you to pick up at your convenience: Tramadol 50 mg, please take as directed  We have given you samples of the following medication to take: Lialda, please take two tablets by mouth once daily Prescription was sent as well  Your physician has requested that you go to the basement for the following lab work before leaving today: Sedimentation Rate CBC  ____________________________________________________                                               We are excited to introduce MyChart, a new best-in-class service that provides you online access to important information in your electronic medical record. We want to make it easier for you to view your health information - all in one secure location - when and where you need it. We expect MyChart will enhance the quality of care and service we provide.  When you register for MyChart, you can:    View your test results.    Request appointments and receive appointment reminders via email.    Request medication renewals.    View your medical history, allergies, medications and immunizations.    Communicate with your physician's office through a password-protected site.    Conveniently print information such as your medication lists.  To find out if MyChart is right for you, please talk to a member of our clinical staff today. We will gladly answer your questions about this free health and wellness tool.  If you are age 66 or older and want a member of your family to have access to your record, you must provide written consent by completing a proxy form available at our office. Please speak to our clinical staff about guidelines regarding accounts for patients younger than age 32.  As you activate your MyChart account and need any technical assistance, please call the MyChart technical support line at [phone])  or email your question to [email or web address]. If you email your question(s), please include your name, a return phone number and the best time to reach you.  If you have non-urgent health-related questions, you can send a message to our office through MyChart at [email or web address]. If you have a medical emergency, call 911.  Thank you for using MyChart as your new health and wellness resource!   MyChart licensed from [employer],  [id number]. Patents Pending.

## 2012-11-07 LAB — SEDIMENTATION RATE: Sed Rate: 13 mm/hr (ref 0–22)

## 2012-12-11 ENCOUNTER — Encounter: Payer: Self-pay | Admitting: Gastroenterology

## 2012-12-11 ENCOUNTER — Ambulatory Visit (INDEPENDENT_AMBULATORY_CARE_PROVIDER_SITE_OTHER): Payer: 59 | Admitting: Gastroenterology

## 2012-12-11 VITALS — BP 122/80 | HR 64 | Ht 70.75 in | Wt 198.5 lb

## 2012-12-11 DIAGNOSIS — G8929 Other chronic pain: Secondary | ICD-10-CM

## 2012-12-11 DIAGNOSIS — R1032 Left lower quadrant pain: Secondary | ICD-10-CM

## 2012-12-11 DIAGNOSIS — K5732 Diverticulitis of large intestine without perforation or abscess without bleeding: Secondary | ICD-10-CM

## 2012-12-11 NOTE — Patient Instructions (Signed)
You have been scheduled for an appointment with Dr. Michaell Cowing at [facility]. Your appointment is on 12-24-2012 at 4 pm. Please arrive at 3:30 pm for registration. Make certain to bring a list of current medications, including any over the counter medications or vitamins. Also bring your co-pay if you have one as well as your insurance cards. [facility] is located at [address]. Should you need to reschedule your appointment, please contact them at [phone].

## 2012-12-11 NOTE — Progress Notes (Signed)
This is a 52 year old Caucasian male with persistent left lower quadrant pain with episodes of recent documented diverticulitis treated with 2 courses of antibiotics.  He continues with vague dull discomfort in his left lower quadrant with occasional sharp pains despite a months trial of by mouth Lialda.  He is on fiber supplements, in the past did not have any benefit from antispasmodics.  He denies rectal bleeding, upper GI or hepatobiliary complaints.  His recent CT scan was May 28 of this year.  His colonoscopy was performed on April 21 of this year.  Current Medications, Allergies, Past Medical History, Past Surgical History, Family History and Social History were reviewed in [employer] record.  ROS: All systems were reviewed and are negative unless otherwise stated in the HPI.          Physical Exam: Blood pressure 122/80, pulse 64 and regular and weight 198.  Healthy-appearing patient in no acute distress.  Abdominal exam unremarkable except for some tenderness to deep palpation left lower quadrant area.  Bowel sounds are normal.  Rectal exam is deferred.  Mental status normal.    Assessment and Plan: Persistent symptomatic sigmoid colon diverticular disease despite 2 courses of antibiotics and  Empiric trial of oral aminosalicylate therapy.  This patient is not happy that his pain is still persistent, and I think that surgical sigmoid resection is probably in order, and hopefully can be done laparoscopically.  I've scheduled him to see Dr. Karie Soda at [facility] for his opinion and evaluation.  For now we'll continue all medications as listed and reviewed.

## 2012-12-12 ENCOUNTER — Telehealth (INDEPENDENT_AMBULATORY_CARE_PROVIDER_SITE_OTHER): Payer: Self-pay

## 2012-12-12 NOTE — Telephone Encounter (Signed)
LMOM at home and cell. I wanted to see if the pt wanted to move his appt up from 8/25 to 8/20 with Dr Michaell Cowing.

## 2012-12-12 NOTE — Telephone Encounter (Signed)
Pt returned my call and declined the earlier appt for 8/25 b/c he has a really busy work week next week. The pt appreciated the offer.

## 2012-12-24 ENCOUNTER — Encounter (INDEPENDENT_AMBULATORY_CARE_PROVIDER_SITE_OTHER): Payer: Self-pay | Admitting: Surgery

## 2012-12-24 ENCOUNTER — Ambulatory Visit (INDEPENDENT_AMBULATORY_CARE_PROVIDER_SITE_OTHER): Payer: 59 | Admitting: Surgery

## 2012-12-24 VITALS — BP 142/84 | HR 64 | Resp 16 | Ht 72.0 in | Wt 197.8 lb

## 2012-12-24 DIAGNOSIS — K5732 Diverticulitis of large intestine without perforation or abscess without bleeding: Secondary | ICD-10-CM

## 2012-12-24 DIAGNOSIS — K5792 Diverticulitis of intestine, part unspecified, without perforation or abscess without bleeding: Secondary | ICD-10-CM | POA: Insufficient documentation

## 2012-12-24 MED ORDER — METRONIDAZOLE 500 MG PO TABS: 500.0000 mg | ORAL_TABLET | ORAL | Status: DC

## 2012-12-24 MED ORDER — NEOMYCIN SULFATE 500 MG PO TABS: 1000.0000 mg | ORAL_TABLET | ORAL | Status: DC

## 2012-12-24 NOTE — Progress Notes (Signed)
Subjective:     Patient ID: Keith Schmitt, male   DOB: [birth year], 52 y.o.   MRN: [id number]  HPI  Keith Schmitt  [birth year] [id number]  Patient Care Team: [name], MD as PCP - General [name], MD as Consulting Physician (General Surgery) [name], MD as Consulting Physician (Gastroenterology)  This patient is a 52 y.o.male who presents today for surgical evaluation at the request of Dr. Jarold Motto.   Reason for visit: Recurrent sigmoid diverticulitis  Pleasant active male.  Has had episodes of intermittent left lower quadrant pressure/pain for years.  Usually mild and not intense.  However, this year he began to have attacks every month.  Became more intense.  More severe.  Pain would last up to a week.  Diagnosed with diverticulitis in February.  Improved with antibiotics.  Recurred.  Back on antibiotics.  Had colonoscopy in April showing diverticulosis of moderate severity and left colon.  Nothing else.  He keeps getting recurrent attacks despite switching to a bland diet.  Because of a more chronic pain and recurrent attacks, surgical consultation requested.  Patient normally has a bowel movement every morning.  However, he now gets 4-5 frequent bowel movements and urgency.  No blood.  Had open appendectomy but no other surgeries.  He swims a mile three times a week and rides his bicycle almost on a daily basis.   No exertional chest/neck/shoulder/arm pain.  No personal nor family history of GI/colon cancer, inflammatory bowel disease, irritable bowel syndrome, allergy such as Celiac Sprue, dietary/dairy problems, colitis, ulcers nor gastritis.  No recent sick contacts/gastroenteritis.  No travel outside the country.  No changes in diet.    Patient Active Problem List   Diagnosis Date Noted  . Diverticulitis - recurrent 12/24/2012  . Chest pain 01/25/2012  . Heel pain 05/04/2011  . Rotator cuff tendonitis 12/14/2010  . Stress fracture of left  tibia with delayed healing 11/02/2010  . Elbow pain, left 07/30/2010  . 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 ABNORMALITY OF GAIT 07/07/2010  . Basal cell cancer 04/24/2007  . HYPERLIPIDEMIA NEC/NOS 11/14/2006    Past Medical History  Diagnosis Date  . History of Osgood-Schlatter disease   . Hyperlipidemia   . NEVUS, ATYPICAL   . UNEQUAL LEG LENGTH   . Abnormality of gait   . Elbow pain, left   . Stress fracture of left tibia with delayed healing   . Rotator cuff tendonitis   . Heel pain   . Diverticulosis of colon (without mention of hemorrhage)   . Colon polyp     Tubular Adenoma   . Diverticulitis     Past Surgical History  Procedure Laterality Date  . Surgery r knee post trauma  1988  . Appendectomy  1999    Dr Gerrit Friends  . Vasectomy  1998  . Refractive surgery  2003     Dr Delaney Meigs    History   Social History  . Marital Status: Married    Spouse Name: N/A    Number of Children: N/A  . Years of Education: N/A   Occupational History  . Not on file.   Social History Main Topics  . Smoking status: Never Smoker   . Smokeless tobacco: Never Used  . Alcohol Use: 3.0 oz/week    5 Cans of beer per week  . Drug Use: No  . Sexual Activity: Not on file   Other Topics Concern  . Not on file  Social History Narrative  . No narrative on file    Family History  Problem Relation Age of Onset  . Heart attack Father 84  . Alcohol abuse Father   . Heart attack Mother 49  . Alcohol abuse Mother   . Heart attack Paternal Uncle 59  . Cancer Neg Hx   . Diabetes Neg Hx   . Stroke Neg Hx     Current Outpatient Prescriptions  Medication Sig Dispense Refill  . aspirin 81 MG tablet Take 81 mg by mouth daily.      . mesalamine (LIALDA) 1.2 G EC tablet Take two tablets by mouth once daily  60 tablet  3  . Multiple Vitamin (MULTIVITAMIN) tablet Take 1 tablet by mouth daily.       No current facility-administered medications for this visit.     Allergies  Allergen  Reactions  . Vytorin [Ezetimibe-Simvastatin]     Muscle pain    BP 142/84  Pulse 64  Resp 16  Ht 6' (1.829 m)  Wt 197 lb 12.8 oz (89.721 kg)  BMI 26.82 kg/m2  No results found.   Review of Systems  Constitutional: Negative for fever, chills and diaphoresis.  HENT: Negative for nosebleeds, sore throat, facial swelling, mouth sores, trouble swallowing and ear discharge.   Eyes: Negative for photophobia, discharge and visual disturbance.  Respiratory: Negative for choking, chest tightness, shortness of breath and stridor.   Cardiovascular: Negative for chest pain and palpitations.  Gastrointestinal: Positive for abdominal pain and diarrhea. Negative for nausea, vomiting, constipation, blood in stool, abdominal distention, anal bleeding and rectal pain.  Endocrine: Negative for cold intolerance and heat intolerance.  Genitourinary: Negative for dysuria, urgency, difficulty urinating and testicular pain.  Musculoskeletal: Negative for myalgias, back pain, arthralgias and gait problem.  Skin: Negative for color change, pallor, rash and wound.  Allergic/Immunologic: Negative for environmental allergies and food allergies.  Neurological: Negative for dizziness, speech difficulty, weakness, numbness and headaches.  Hematological: Negative for adenopathy. Does not bruise/bleed easily.  Psychiatric/Behavioral: Negative for hallucinations, confusion and agitation.       Objective:   Physical Exam  Constitutional: He is oriented to person, place, and time. He appears well-developed and well-nourished. No distress.  HENT:  Head: Normocephalic.  Mouth/Throat: Oropharynx is clear and moist. No oropharyngeal exudate.  Eyes: Conjunctivae and EOM are normal. Pupils are equal, round, and reactive to light. No scleral icterus.  Neck: Normal range of motion. Neck supple. No tracheal deviation present.  Cardiovascular: Normal rate, regular rhythm and intact distal pulses.   Pulmonary/Chest: Effort  normal and breath sounds normal. No respiratory distress.  Abdominal: Soft. He exhibits no distension. There is tenderness in the left lower quadrant. There is no rigidity, no rebound, no guarding, no CVA tenderness, no tenderness at McBurney's point and negative Murphy's sign. No hernia. Hernia confirmed negative in the right inguinal area and confirmed negative in the left inguinal area.  Musculoskeletal: Normal range of motion. He exhibits no tenderness.  Lymphadenopathy:    He has no cervical adenopathy.       Right: No inguinal adenopathy present.       Left: No inguinal adenopathy present.  Neurological: He is alert and oriented to person, place, and time. No cranial nerve deficit. He exhibits normal muscle tone. Coordination normal.  Skin: Skin is warm and dry. No rash noted. He is not diaphoretic. No erythema. No pallor.  Psychiatric: He has a normal mood and affect. His behavior is normal. Judgment and  thought content normal.       Assessment:     Recurrent diverticulitis with chronic LLQ pressure/pain, concern for chronic diverticulitis    Plan:     I had long discussion with the patient.  I think he is WAY past the minimal requirement of 4 mild attacks.  He is hit the chronic diverticulitis stage.  I am concerned he could develop stricture or even develop significant abscess/perforation if this persists.  I see no other options aside from resection of the inflamed/irritated segment to help resolve this.  Given the fact extends to the descending colon, I suspect he will need splenic flexure mobilization as well.  Good laparoscopic candidate.  While not excited about surgery, he seems convinced that this is his best option to definitively deal with the problem:  The anatomy & physiology of the digestive tract was discussed.  The pathophysiology was discussed.  Natural history risks without surgery was discussed.   I feel the risks of no intervention will lead to serious problems that  outweigh the operative risks; therefore, I recommended a partial colectomy to remove the pathology.  Laparoscopic & open techniques were discussed.   Risks such as bleeding, infection, abscess, leak, reoperation, possible ostomy, hernia, heart attack, death, and other risks were discussed.  I noted a good likelihood this will help address the problem.   Goals of post-operative recovery were discussed as well.  We will work to minimize complications.  An educational handout on the pathology was given as well.  Questions were answered.  The patient expresses understanding & wishes to proceed with surgery.

## 2012-12-24 NOTE — Patient Instructions (Signed)
See the Handout(s) we gave you.  Consider surgery.  Please call our office at [phone] if you wish to schedule surgery or if you have further questions / concerns.   Diverticulitis A diverticulum is a small pouch or sac on the colon. Diverticulosis is the presence of these diverticula on the colon. Diverticulitis is the irritation (inflammation) or infection of diverticula. CAUSES  The colon and its diverticula contain bacteria. If food particles block the tiny opening to a diverticulum, the bacteria inside can grow and cause an increase in pressure. This leads to infection and inflammation and is called diverticulitis. SYMPTOMS   Abdominal pain and tenderness. Usually, the pain is located on the left side of your abdomen. However, it could be located elsewhere.  Fever.  Bloating.  Feeling sick to your stomach (nausea).  Throwing up (vomiting).  Abnormal stools. DIAGNOSIS  Your caregiver will take a history and perform a physical exam. Since many things can cause abdominal pain, other tests may be necessary. Tests may include:  Blood tests.  Urine tests.  X-ray of the abdomen.  CT scan of the abdomen. Sometimes, surgery is needed to determine if diverticulitis or other conditions are causing your symptoms. TREATMENT  Most of the time, you can be treated without surgery. Treatment includes:  Resting the bowels by only having liquids for a few days. As you improve, you will need to eat a low-fiber diet.  Intravenous (IV) fluids if you are losing body fluids (dehydrated).  Antibiotic medicines that treat infections may be given.  Pain and nausea medicine, if needed.  Surgery if the inflamed diverticulum has burst. HOME CARE INSTRUCTIONS   Try a clear liquid diet (broth, tea, or water for as long as directed by your caregiver). You may then gradually begin a low-fiber diet as tolerated.  A low-fiber diet is a diet with less than 10 grams of fiber. Choose the foods  below to reduce fiber in the diet:  White breads, cereals, rice, and pasta.  Cooked fruits and vegetables or soft fresh fruits and vegetables without the skin.  Ground or well-cooked tender beef, ham, veal, lamb, pork, or poultry.  Eggs and seafood.  After your diverticulitis symptoms have improved, your caregiver may put you on a high-fiber diet. A high-fiber diet includes 14 grams of fiber for every 1000 calories consumed. For a standard 2000 calorie diet, you would need 28 grams of fiber. Follow these diet guidelines to help you increase the fiber in your diet. It is important to slowly increase the amount fiber in your diet to avoid gas, constipation, and bloating.  Choose whole-grain breads, cereals, pasta, and brown rice.  Choose fresh fruits and vegetables with the skin on. Do not overcook vegetables because the more vegetables are cooked, the more fiber is lost.  Choose more nuts, seeds, legumes, dried peas, beans, and lentils.  Look for food products that have greater than 3 grams of fiber per serving on the Nutrition Facts label.  Take all medicine as directed by your caregiver.  If your caregiver has given you a follow-up appointment, it is very important that you go. Not going could result in lasting (chronic) or permanent injury, pain, and disability. If there is any problem keeping the appointment, call to reschedule. SEEK MEDICAL CARE IF:   Your pain does not improve.  You have a hard time advancing your diet beyond clear liquids.  Your bowel movements do not return to normal. SEEK IMMEDIATE MEDICAL CARE IF:  Your pain becomes worse.  You have an oral temperature above 102 F (38.9 C), not controlled by medicine.  You have repeated vomiting.  You have bloody or black, tarry stools.  Symptoms that brought you to your caregiver become worse or are not getting better. MAKE SURE YOU:   Understand these instructions.  Will watch your condition.  Will get  help right away if you are not doing well or get worse. Document Released: 01/26/2005 Document Revised: 07/11/2011 Document Reviewed: 05/24/2010 [facility] Patient Information 2014 [place], [place].  ABDOMINAL SURGERY: POST OP INSTRUCTIONS  1. DIET: Follow a light bland diet the first 24 hours after arrival home, such as soup, liquids, crackers, etc.  Be sure to include lots of fluids daily.  Avoid fast food or heavy meals as your are more likely to get nauseated.  Eat a low fat the next few days after surgery.   2. Take your usually prescribed home medications unless otherwise directed. 3. PAIN CONTROL: a. Pain is best controlled by a usual combination of three different methods TOGETHER: i. Ice/Heat ii. Over the counter pain medication iii. Prescription pain medication b. Most patients will experience some swelling and bruising around the incisions.  Ice packs or heating pads (30-60 minutes up to 6 times a day) will help. Use ice for the first few days to help decrease swelling and bruising, then switch to heat to help relax tight/sore spots and speed recovery.  Some people prefer to use ice alone, heat alone, alternating between ice & heat.  Experiment to what works for you.  Swelling and bruising can take several weeks to resolve.   c. It is helpful to take an over-the-counter pain medication regularly for the first few weeks.  Choose one of the following that works best for you: i. Naproxen (Aleve, etc)  Two 220mg  tabs twice a day ii. Ibuprofen (Advil, etc) Three 200mg  tabs four times a day (every meal & bedtime) iii. Acetaminophen (Tylenol, etc) 500-650mg  four times a day (every meal & bedtime) d. A  prescription for pain medication (such as oxycodone, hydrocodone, etc) should be given to you upon discharge.  Take your pain medication as prescribed.  i. If you are having problems/concerns with the prescription medicine (does not control pain, nausea, vomiting, rash, itching, etc), please call  us [phone] to see if we need to switch you to a different pain medicine that will work better for you and/or control your side effect better. ii. If you need a refill on your pain medication, please contact your pharmacy.  They will contact our office to request authorization. Prescriptions will not be filled after 5 pm or on week-ends. 4. Avoid getting constipated.  Between the surgery and the pain medications, it is common to experience some constipation.  Increasing fluid intake and taking a fiber supplement (such as Metamucil, Citrucel, FiberCon, MiraLax, etc) 1-2 times a day regularly will usually help prevent this problem from occurring.  A mild laxative (prune juice, Milk of Magnesia, MiraLax, etc) should be taken according to package directions if there are no bowel movements after 48 hours.   5. Watch out for diarrhea.  If you have many loose bowel movements, simplify your diet to bland foods & liquids for a few days.  Stop any stool softeners and decrease your fiber supplement.  Switching to mild anti-diarrheal medications (Kayopectate, Pepto Bismol) can help.  If this worsens or does not improve, please call us. 6. Wash / shower every day.  You  may shower over the incision / wound.  Avoid baths until the skin is fully healed.  Continue to shower over incision(s) after the dressing is off. 7. Remove your waterproof bandages 5 days after surgery.  You may leave the incision open to air.  You may replace a dressing/Band-Aid to cover the incision for comfort if you wish. 8. ACTIVITIES as tolerated:   a. You may resume regular (light) daily activities beginning the next day-such as daily self-care, walking, climbing stairs-gradually increasing activities as tolerated.  If you can walk 30 minutes without difficulty, it is safe to try more intense activity such as jogging, treadmill, bicycling, low-impact aerobics, swimming, etc. b. Save the most intensive and strenuous activity for last such as  sit-ups, heavy lifting, contact sports, etc  Refrain from any heavy lifting or straining until you are off narcotics for pain control.   c. DO NOT PUSH THROUGH PAIN.  Let pain be your guide: If it hurts to do something, don't do it.  Pain is your body warning you to avoid that activity for another week until the pain goes down. d. You may drive when you are no longer taking prescription pain medication, you can comfortably wear a seatbelt, and you can safely maneuver your car and apply brakes. e. Bonita Quin may have sexual intercourse when it is comfortable.  9. FOLLOW UP in our office a. Please call [facility] at [phone] to set up an appointment to see your surgeon in the office for a follow-up appointment approximately 1-2 weeks after your surgery. b. Make sure that you call for this appointment the day you arrive home to insure a convenient appointment time. 10. IF YOU HAVE DISABILITY OR FAMILY LEAVE FORMS, BRING THEM TO THE OFFICE FOR PROCESSING.  DO NOT GIVE THEM TO YOUR DOCTOR.   WHEN TO CALL us [phone]: 1. Poor pain control 2. Reactions / problems with new medications (rash/itching, nausea, etc)  3. Fever over 101.5 F (38.5 C) 4. Inability to urinate 5. Nausea and/or vomiting 6. Worsening swelling or bruising 7. Continued bleeding from incision. 8. Increased pain, redness, or drainage from the incision  The clinic staff is available to answer your questions during regular business hours (8:30am-5pm).  Please don't hesitate to call and ask to speak to one of our nurses for clinical concerns.   A surgeon from [facility] is always on call at the hospitals   If you have a medical emergency, go to the nearest emergency room or call 911.    [facility] [address], [place], [place]  [address] ? MAIN: [phone] ? TOLL FREE: [phone] ? FAX [phone] [email or web address]  GETTING TO GOOD BOWEL HEALTH. Irregular  bowel habits such as constipation and diarrhea can lead to many problems over time.  Having one soft bowel movement a day is the most important way to prevent further problems.  The anorectal canal is designed to handle stretching and feces to safely manage our ability to get rid of solid waste (feces, poop, stool) out of our body.  BUT, hard constipated stools can act like ripping concrete bricks and diarrhea can be a burning fire to this very sensitive area of our body, causing inflamed hemorrhoids, anal fissures, increasing risk is perirectal abscesses, abdominal pain/bloating, an making irritable bowel worse.     The goal: ONE SOFT BOWEL MOVEMENT A DAY!  To have soft, regular bowel movements:    Drink at least 8 tall  glasses of water a day.     Take plenty of fiber.  Fiber is the undigested part of plant food that passes into the colon, acting s "natures broom" to encourage bowel motility and movement.  Fiber can absorb and hold large amounts of water. This results in a larger, bulkier stool, which is soft and easier to pass. Work gradually over several weeks up to 6 servings a day of fiber (25g a day even more if needed) in the form of: o Vegetables -- Root (potatoes, carrots, turnips), leafy green (lettuce, salad greens, celery, spinach), or cooked high residue (cabbage, broccoli, etc) o Fruit -- Fresh (unpeeled skin & pulp), Dried (prunes, apricots, cherries, etc ),  or stewed ( applesauce)  o Whole grain breads, pasta, etc (whole wheat)  o Bran cereals    Bulking Agents -- This type of water-retaining fiber generally is easily obtained each day by one of the following:  o Psyllium bran -- The psyllium plant is remarkable because its ground seeds can retain so much water. This product is available as Metamucil, Konsyl, Effersyllium, Per Diem Fiber, or the less expensive generic preparation in drug and health food stores. Although labeled a laxative, it really is not a laxative.  o Methylcellulose --  This is another fiber derived from wood which also retains water. It is available as Citrucel. o Polyethylene Glycol - and "artificial" fiber commonly called Miralax or Glycolax.  It is helpful for people with gassy or bloated feelings with regular fiber o Flax Seed - a less gassy fiber than psyllium   No reading or other relaxing activity while on the toilet. If bowel movements take longer than 5 minutes, you are too constipated   AVOID CONSTIPATION.  High fiber and water intake usually takes care of this.  Sometimes a laxative is needed to stimulate more frequent bowel movements, but    Laxatives are not a good long-term solution as it can wear the colon out. o Osmotics (Milk of Magnesia, Fleets phosphosoda, Magnesium citrate, MiraLax, GoLytely) are safer than  o Stimulants (Senokot, Castor Oil, Dulcolax, Ex Lax)    o Do not take laxatives for more than 7days in a row.    IF SEVERELY CONSTIPATED, try a Bowel Retraining Program: o Do not use laxatives.  o Eat a diet high in roughage, such as bran cereals and leafy vegetables.  o Drink six (6) ounces of prune or apricot juice each morning.  o Eat two (2) large servings of stewed fruit each day.  o Take one (1) heaping tablespoon of a psyllium-based bulking agent twice a day. Use sugar-free sweetener when possible to avoid excessive calories.  o Eat a normal breakfast.  o Set aside 15 minutes after breakfast to sit on the toilet, but do not strain to have a bowel movement.  o If you do not have a bowel movement by the third day, use an enema and repeat the above steps.    Controlling diarrhea o Switch to liquids and simpler foods for a few days to avoid stressing your intestines further. o Avoid dairy products (especially milk & ice cream) for a short time.  The intestines often can lose the ability to digest lactose when stressed. o Avoid foods that cause gassiness or bloating.  Typical foods include beans and other legumes, cabbage, broccoli,  and dairy foods.  Every person has some sensitivity to other foods, so listen to our body and avoid those foods that trigger problems for you. o  Adding fiber (Citrucel, Metamucil, psyllium, Miralax) gradually can help thicken stools by absorbing excess fluid and retrain the intestines to act more normally.  Slowly increase the dose over a few weeks.  Too much fiber too soon can backfire and cause cramping & bloating. o Probiotics (such as active yogurt, Align, etc) may help repopulate the intestines and colon with normal bacteria and calm down a sensitive digestive tract.  Most studies show it to be of mild help, though, and such products can be costly. o Medicines:   Bismuth subsalicylate (ex. Kayopectate, Pepto Bismol) every 30 minutes for up to 6 doses can help control diarrhea.  Avoid if pregnant.   Loperamide (Immodium) can slow down diarrhea.  Start with two tablets (4mg  total) first and then try one tablet every 6 hours.  Avoid if you are having fevers or severe pain.  If you are not better or start feeling worse, stop all medicines and call your doctor for advice o Call your doctor if you are getting worse or not better.  Sometimes further testing (cultures, endoscopy, X-ray studies, bloodwork, etc) may be needed to help diagnose and treat the cause of the diarrhea. o

## 2013-01-15 ENCOUNTER — Telehealth (INDEPENDENT_AMBULATORY_CARE_PROVIDER_SITE_OTHER): Payer: Self-pay

## 2013-01-15 NOTE — Telephone Encounter (Signed)
LMOM for pt to call me back so Dr Michaell Cowing can speak to him about surgery. The pt is scheduled for surgery by Dr Michaell Cowing on 01/31/13.

## 2013-01-21 ENCOUNTER — Encounter (HOSPITAL_COMMUNITY): Payer: Self-pay | Admitting: Pharmacy Technician

## 2013-01-23 ENCOUNTER — Encounter (HOSPITAL_COMMUNITY)
Admission: RE | Admit: 2013-01-23 | Discharge: 2013-01-23 | Disposition: A | Discharge status: Home / Self Care | Payer: 59 | Source: Ambulatory Visit | Attending: Surgery | Admitting: Surgery

## 2013-01-23 ENCOUNTER — Encounter (HOSPITAL_COMMUNITY): Payer: Self-pay

## 2013-01-23 DIAGNOSIS — M79673 Pain in unspecified foot: Secondary | ICD-10-CM

## 2013-01-23 DIAGNOSIS — Z01818 Encounter for other preprocedural examination: Secondary | ICD-10-CM | POA: Insufficient documentation

## 2013-01-23 DIAGNOSIS — Z01812 Encounter for preprocedural laboratory examination: Secondary | ICD-10-CM | POA: Insufficient documentation

## 2013-01-23 HISTORY — DX: Pain in unspecified foot: M79.673

## 2013-01-23 LAB — CBC
Platelets: 211 10*3/uL (ref 150–400)
RBC: 5.03 MIL/uL (ref 4.22–5.81)
RDW: 12.1 % (ref 11.5–15.5)
WBC: 6.6 10*3/uL (ref 4.0–10.5)

## 2013-01-23 NOTE — Patient Instructions (Addendum)
Keith Schmitt  01/23/2013   Your procedure is scheduled on: 10-2  -2014  Report to [facility] at  0530      AM.  Call this number if you have problems the morning of surgery: [phone]  Or Presurgical Testing [phone](Tynell Winchell)   Remember: Follow any bowel prep instructions per MD office.   Do not eat food:After Midnight.  .  Take these medicines the morning of surgery with A SIP OF WATER: none   Do not wear jewelry, make-up or nail polish.  Do not wear lotions, powders, or perfumes. You may wear deodorant.  Do not shave 12 hours prior to first CHG shower(legs and under arms).(face and neck okay.)  Do not bring valuables to the hospital.  Contacts, dentures or bridgework,body piercing,  may not be worn into surgery.  Leave suitcase in the car. After surgery it may be brought to your room.  For patients admitted to the hospital, checkout time is 11:00 AM the day of discharge.   Patients discharged the day of surgery will not be allowed to drive home. Must have responsible person with you x 24 hours once discharged.  Name and phone number of your driver: Lucille Crichlow, spouse [phone] cell  Special Instructions: CHG(Chlorhedine 4%-"Hibiclens","Betasept","Aplicare") Shower Use Special Wash: see special instructions.(avoid face and genitals)   Please read over the following fact sheets that you were given: Blood Transfusion fact sheet, Incentive Spirometry Instruction.    Failure to follow these instructions may result in Cancellation of your surgery.   Patient signature_______________________________________________________

## 2013-01-30 MED ORDER — BUPIVACAINE 0.25 % ON-Q PUMP DUAL CATH 300 ML
300.0000 mL | INJECTION | Status: DC
  Filled 2013-01-30: qty 300

## 2013-01-30 MED ORDER — SODIUM CHLORIDE 0.9 % IV SOLN
INTRAVENOUS | Status: DC
  Filled 2013-01-30: qty 6

## 2013-01-30 NOTE — Anesthesia Preprocedure Evaluation (Addendum)
Anesthesia Evaluation  Patient identified by MRN, date of birth, ID band Patient awake    Reviewed: Allergy & Precautions, H&P , NPO status , Patient's Chart, lab work & pertinent test results  Airway Mallampati: II TM Distance: >3 FB Neck ROM: Full    Dental no notable dental hx.    Pulmonary neg pulmonary ROS,  breath sounds clear to auscultation  Pulmonary exam normal       Cardiovascular Exercise Tolerance: Good negative cardio ROS  Rhythm:Regular Rate:Normal  ECG: SB otherwise normal.   Neuro/Psych Osgood-Schlader disease.  Neuromuscular disease negative neurological ROS  negative psych ROS   GI/Hepatic negative GI ROS, Neg liver ROS,   Endo/Other  negative endocrine ROS  Renal/GU negative Renal ROS  negative genitourinary   Musculoskeletal negative musculoskeletal ROS (+)   Abdominal   Peds negative pediatric ROS (+)  Hematology negative hematology ROS (+)   Anesthesia Other Findings   Reproductive/Obstetrics negative OB ROS                          Anesthesia Physical Anesthesia Plan  ASA: II  Anesthesia Plan: General   Post-op Pain Management:    Induction: Intravenous  Airway Management Planned: Oral ETT  Additional Equipment:   Intra-op Plan:   Post-operative Plan: Extubation in OR  Informed Consent: I have reviewed the patients History and Physical, chart, labs and discussed the procedure including the risks, benefits and alternatives for the proposed anesthesia with the patient or authorized representative who has indicated his/her understanding and acceptance.   Dental advisory given  Plan Discussed with: CRNA  Anesthesia Plan Comments:         Anesthesia Quick Evaluation

## 2013-01-31 ENCOUNTER — Inpatient Hospital Stay (HOSPITAL_COMMUNITY)
Admission: RE | Admit: 2013-01-31 | Discharge: 2013-02-03 | DRG: 331 | Disposition: A | Discharge status: Home / Self Care | Payer: 59 | Source: Ambulatory Visit | Attending: Surgery | Admitting: Surgery

## 2013-01-31 ENCOUNTER — Encounter (HOSPITAL_COMMUNITY): Payer: Self-pay | Admitting: Anesthesiology

## 2013-01-31 ENCOUNTER — Encounter (HOSPITAL_COMMUNITY): Payer: Self-pay | Admitting: *Deleted

## 2013-01-31 ENCOUNTER — Encounter (HOSPITAL_COMMUNITY)
Admission: RE | Disposition: A | Discharge status: Home / Self Care | Payer: Self-pay | Source: Ambulatory Visit | Attending: Surgery

## 2013-01-31 ENCOUNTER — Inpatient Hospital Stay (HOSPITAL_COMMUNITY): Payer: 59 | Admitting: Anesthesiology

## 2013-01-31 DIAGNOSIS — K5732 Diverticulitis of large intestine without perforation or abscess without bleeding: Principal | ICD-10-CM | POA: Diagnosis present

## 2013-01-31 DIAGNOSIS — K5792 Diverticulitis of intestine, part unspecified, without perforation or abscess without bleeding: Secondary | ICD-10-CM | POA: Diagnosis present

## 2013-01-31 DIAGNOSIS — K5730 Diverticulosis of large intestine without perforation or abscess without bleeding: Secondary | ICD-10-CM

## 2013-01-31 DIAGNOSIS — Z01812 Encounter for preprocedural laboratory examination: Secondary | ICD-10-CM

## 2013-01-31 DIAGNOSIS — Z9089 Acquired absence of other organs: Secondary | ICD-10-CM

## 2013-01-31 DIAGNOSIS — E785 Hyperlipidemia, unspecified: Secondary | ICD-10-CM | POA: Diagnosis present

## 2013-01-31 DIAGNOSIS — Z792 Long term (current) use of antibiotics: Secondary | ICD-10-CM

## 2013-01-31 HISTORY — PX: LAPAROSCOPIC PARTIAL COLECTOMY: SHX5907

## 2013-01-31 HISTORY — PX: PROCTOSCOPY: SHX2266

## 2013-01-31 HISTORY — PX: LAPAROSCOPIC SIGMOID COLECTOMY: SHX5928

## 2013-01-31 LAB — TYPE AND SCREEN
ABO/RH(D): O POS
Antibody Screen: NEGATIVE

## 2013-01-31 SURGERY — LAPAROSCOPIC PARTIAL COLECTOMY
Anesthesia: General | Site: Rectum | Wound class: Clean Contaminated

## 2013-01-31 MED ORDER — HEPARIN SODIUM (PORCINE) 5000 UNIT/ML IJ SOLN
5000.0000 [IU] | Freq: Once | INTRAMUSCULAR | Status: AC
  Administered 2013-01-31: 5000 [IU] via SUBCUTANEOUS
  Filled 2013-01-31: qty 1

## 2013-01-31 MED ORDER — KETOROLAC TROMETHAMINE 30 MG/ML IJ SOLN
INTRAMUSCULAR | Status: DC | PRN
  Administered 2013-01-31: 30 mg via INTRAVENOUS

## 2013-01-31 MED ORDER — SODIUM CHLORIDE 0.9 % IR SOLN
Status: DC | PRN
  Administered 2013-01-31: 1000 mL

## 2013-01-31 MED ORDER — SACCHAROMYCES BOULARDII 250 MG PO CAPS
250.0000 mg | ORAL_CAPSULE | Freq: Two times a day (BID) | ORAL | Status: DC
  Administered 2013-01-31 – 2013-02-03 (×7): 250 mg via ORAL
  Filled 2013-01-31 (×8): qty 1

## 2013-01-31 MED ORDER — LIDOCAINE HCL (CARDIAC) 20 MG/ML IV SOLN
INTRAVENOUS | Status: DC | PRN
  Administered 2013-01-31: 75 mg via INTRAVENOUS

## 2013-01-31 MED ORDER — EPHEDRINE SULFATE 50 MG/ML IJ SOLN
INTRAMUSCULAR | Status: DC | PRN
  Administered 2013-01-31: 5 mg via INTRAVENOUS

## 2013-01-31 MED ORDER — BUPIVACAINE-EPINEPHRINE 0.25% -1:200000 IJ SOLN
INTRAMUSCULAR | Status: DC | PRN
  Administered 2013-01-31: 60 mL

## 2013-01-31 MED ORDER — OXYCODONE HCL 5 MG PO TABS: 5.0000 mg | ORAL_TABLET | ORAL | Status: DC | PRN

## 2013-01-31 MED ORDER — BUPIVACAINE-EPINEPHRINE 0.25% -1:200000 IJ SOLN
INTRAMUSCULAR | Status: AC
  Filled 2013-01-31: qty 1

## 2013-01-31 MED ORDER — PROMETHAZINE HCL 25 MG/ML IJ SOLN: 6.2500 mg | Freq: Four times a day (QID) | INTRAMUSCULAR | Status: DC | PRN

## 2013-01-31 MED ORDER — CISATRACURIUM BESYLATE (PF) 10 MG/5ML IV SOLN
INTRAVENOUS | Status: DC | PRN
  Administered 2013-01-31: 6 mg via INTRAVENOUS
  Administered 2013-01-31: 10 mg via INTRAVENOUS

## 2013-01-31 MED ORDER — PROMETHAZINE HCL 25 MG/ML IJ SOLN: 6.2500 mg | INTRAMUSCULAR | Status: DC | PRN

## 2013-01-31 MED ORDER — FENTANYL CITRATE 0.05 MG/ML IJ SOLN
INTRAMUSCULAR | Status: DC | PRN
  Administered 2013-01-31 (×3): 50 ug via INTRAVENOUS

## 2013-01-31 MED ORDER — DEXTROSE 5 % IV SOLN
INTRAVENOUS | Status: AC
  Filled 2013-01-31 (×2): qty 1

## 2013-01-31 MED ORDER — SODIUM CHLORIDE 0.9 % IV SOLN
2.0000 g | INTRAVENOUS | Status: DC | PRN
  Administered 2013-01-31: 2 g via INTRAVENOUS

## 2013-01-31 MED ORDER — ALVIMOPAN 12 MG PO CAPS
12.0000 mg | ORAL_CAPSULE | Freq: Once | ORAL | Status: AC
  Administered 2013-01-31: 12 mg via ORAL
  Filled 2013-01-31: qty 1

## 2013-01-31 MED ORDER — ACETAMINOPHEN 500 MG PO TABS
1000.0000 mg | ORAL_TABLET | Freq: Three times a day (TID) | ORAL | Status: DC
Start: 2013-01-31 — End: 2013-02-03
  Administered 2013-01-31 – 2013-02-03 (×9): 1000 mg via ORAL
  Filled 2013-01-31 (×11): qty 2

## 2013-01-31 MED ORDER — ALVIMOPAN 12 MG PO CAPS
12.0000 mg | ORAL_CAPSULE | Freq: Two times a day (BID) | ORAL | Status: DC
  Administered 2013-02-01 (×2): 12 mg via ORAL
  Filled 2013-01-31 (×4): qty 1

## 2013-01-31 MED ORDER — BUPIVACAINE ON-Q PAIN PUMP (FOR ORDER SET NO CHG)
INJECTION | Status: DC
  Filled 2013-01-31: qty 1

## 2013-01-31 MED ORDER — METOPROLOL TARTRATE 1 MG/ML IV SOLN
5.0000 mg | Freq: Four times a day (QID) | INTRAVENOUS | Status: DC | PRN
  Filled 2013-01-31: qty 5

## 2013-01-31 MED ORDER — MAGIC MOUTHWASH
15.0000 mL | Freq: Four times a day (QID) | ORAL | Status: DC | PRN
  Filled 2013-01-31: qty 15

## 2013-01-31 MED ORDER — BUPIVACAINE LIPOSOME 1.3 % IJ SUSP: 20.0000 mL | Freq: Once | INTRAMUSCULAR | Status: DC

## 2013-01-31 MED ORDER — ZOLPIDEM TARTRATE 5 MG PO TABS: 5.0000 mg | ORAL_TABLET | Freq: Every evening | ORAL | Status: DC | PRN

## 2013-01-31 MED ORDER — PROPOFOL 10 MG/ML IV BOLUS
INTRAVENOUS | Status: DC | PRN
  Administered 2013-01-31: 180 mg via INTRAVENOUS

## 2013-01-31 MED ORDER — DIPHENHYDRAMINE HCL 50 MG/ML IJ SOLN: 12.5000 mg | Freq: Four times a day (QID) | INTRAMUSCULAR | Status: DC | PRN

## 2013-01-31 MED ORDER — LIP MEDEX EX OINT
1.0000 "application " | TOPICAL_OINTMENT | Freq: Two times a day (BID) | CUTANEOUS | Status: DC
  Administered 2013-01-31 – 2013-02-02 (×6): 1 via TOPICAL
  Filled 2013-01-31: qty 7

## 2013-01-31 MED ORDER — DEXTROSE 5 % IV SOLN
2.0000 g | INTRAVENOUS | Status: DC
  Filled 2013-01-31: qty 2

## 2013-01-31 MED ORDER — DIPHENHYDRAMINE HCL 12.5 MG/5ML PO ELIX: 12.5000 mg | ORAL_SOLUTION | Freq: Four times a day (QID) | ORAL | Status: DC | PRN

## 2013-01-31 MED ORDER — GLYCOPYRROLATE 0.2 MG/ML IJ SOLN
INTRAMUSCULAR | Status: DC | PRN
  Administered 2013-01-31: .2 mg via INTRAVENOUS
  Administered 2013-01-31: .4 mg via INTRAVENOUS

## 2013-01-31 MED ORDER — ALUM & MAG HYDROXIDE-SIMETH 200-200-20 MG/5ML PO SUSP: 30.0000 mL | Freq: Four times a day (QID) | ORAL | Status: DC | PRN

## 2013-01-31 MED ORDER — ONDANSETRON HCL 4 MG/2ML IJ SOLN
INTRAMUSCULAR | Status: DC | PRN
  Administered 2013-01-31 (×2): 2 mg via INTRAVENOUS

## 2013-01-31 MED ORDER — NAPROXEN 500 MG PO TABS: 500.0000 mg | ORAL_TABLET | Freq: Two times a day (BID) | ORAL | Status: DC

## 2013-01-31 MED ORDER — MIDAZOLAM HCL 5 MG/5ML IJ SOLN
INTRAMUSCULAR | Status: DC | PRN
  Administered 2013-01-31 (×2): 1 mg via INTRAVENOUS

## 2013-01-31 MED ORDER — NEOSTIGMINE METHYLSULFATE 1 MG/ML IJ SOLN
INTRAMUSCULAR | Status: DC | PRN
  Administered 2013-01-31: 3 mg via INTRAVENOUS

## 2013-01-31 MED ORDER — DEXTROSE 5 % IV SOLN
2.0000 g | Freq: Two times a day (BID) | INTRAVENOUS | Status: AC
  Administered 2013-01-31: 2 g via INTRAVENOUS
  Filled 2013-01-31: qty 2

## 2013-01-31 MED ORDER — KCL IN DEXTROSE-NACL 40-5-0.9 MEQ/L-%-% IV SOLN
INTRAVENOUS | Status: DC
  Administered 2013-01-31 – 2013-02-02 (×3): via INTRAVENOUS
  Filled 2013-01-31 (×3): qty 1000

## 2013-01-31 MED ORDER — LACTATED RINGERS IV SOLN
INTRAVENOUS | Status: DC | PRN
  Administered 2013-01-31 (×4): via INTRAVENOUS

## 2013-01-31 MED ORDER — HYDROMORPHONE HCL PF 1 MG/ML IJ SOLN
0.2500 mg | INTRAMUSCULAR | Status: DC | PRN
Start: 2013-01-31 — End: 2013-01-31

## 2013-01-31 MED ORDER — HYDROMORPHONE HCL PF 1 MG/ML IJ SOLN: 0.5000 mg | INTRAMUSCULAR | Status: DC | PRN

## 2013-01-31 MED ORDER — SUFENTANIL CITRATE 50 MCG/ML IV SOLN
INTRAVENOUS | Status: DC | PRN
  Administered 2013-01-31: 10 ug via INTRAVENOUS
  Administered 2013-01-31 (×2): 5 ug via INTRAVENOUS
  Administered 2013-01-31: 10 ug via INTRAVENOUS
  Administered 2013-01-31: 20 ug via INTRAVENOUS

## 2013-01-31 MED ORDER — HEPARIN SODIUM (PORCINE) 5000 UNIT/ML IJ SOLN
5000.0000 [IU] | Freq: Three times a day (TID) | INTRAMUSCULAR | Status: DC
  Administered 2013-02-01 – 2013-02-03 (×7): 5000 [IU] via SUBCUTANEOUS
  Filled 2013-01-31 (×10): qty 1

## 2013-01-31 MED ORDER — DEXAMETHASONE SODIUM PHOSPHATE 10 MG/ML IJ SOLN
INTRAMUSCULAR | Status: DC | PRN
  Administered 2013-01-31: 10 mg via INTRAVENOUS

## 2013-01-31 MED ORDER — SUCCINYLCHOLINE CHLORIDE 20 MG/ML IJ SOLN
INTRAMUSCULAR | Status: DC | PRN
  Administered 2013-01-31: 120 mg via INTRAVENOUS

## 2013-01-31 SURGICAL SUPPLY — 83 items
APPLIER CLIP 5 13 M/L LIGAMAX5 (MISCELLANEOUS)
APPLIER CLIP ROT 10 11.4 M/L (STAPLE)
APR CLP MED LRG 11.4X10 (STAPLE)
APR CLP MED LRG 5 ANG JAW (MISCELLANEOUS)
BLADE EXTENDED COATED 6.5IN (ELECTRODE) ×3 IMPLANT
BLADE HEX COATED 2.75 (ELECTRODE) ×6 IMPLANT
BLADE SURG 15 STRL LF DISP TIS (BLADE) ×2 IMPLANT
BLADE SURG 15 STRL SS (BLADE) ×2
BLADE SURG SZ10 CARB STEEL (BLADE) ×3 IMPLANT
CABLE HIGH FREQUENCY MONO STRZ (ELECTRODE) ×3 IMPLANT
CANISTER SUCTION 2500CC (MISCELLANEOUS) ×3 IMPLANT
CATH KIT ON Q 7.5IN SLV (PAIN MANAGEMENT) ×6 IMPLANT
CELLS DAT CNTRL 66122 CELL SVR (MISCELLANEOUS) IMPLANT
CLIP APPLIE 5 13 M/L LIGAMAX5 (MISCELLANEOUS) IMPLANT
CLIP APPLIE ROT 10 11.4 M/L (STAPLE) IMPLANT
COVER MAYO STAND STRL (DRAPES) ×6 IMPLANT
DECANTER SPIKE VIAL GLASS SM (MISCELLANEOUS) ×3 IMPLANT
DRAIN CHANNEL 19F RND (DRAIN) IMPLANT
DRAPE LAPAROSCOPIC ABDOMINAL (DRAPES) ×3 IMPLANT
DRAPE LG THREE QUARTER DISP (DRAPES) ×3 IMPLANT
DRAPE UTILITY XL STRL (DRAPES) ×6 IMPLANT
DRAPE WARM FLUID 44X44 (DRAPE) ×3 IMPLANT
DRSG OPSITE POSTOP 4X10 (GAUZE/BANDAGES/DRESSINGS) IMPLANT
DRSG OPSITE POSTOP 4X6 (GAUZE/BANDAGES/DRESSINGS) IMPLANT
DRSG OPSITE POSTOP 4X8 (GAUZE/BANDAGES/DRESSINGS) ×3 IMPLANT
DRSG PAD ABDOMINAL 8X10 ST (GAUZE/BANDAGES/DRESSINGS) IMPLANT
DRSG TEGADERM 2-3/8X2-3/4 SM (GAUZE/BANDAGES/DRESSINGS) ×9 IMPLANT
DRSG TEGADERM 4X4.75 (GAUZE/BANDAGES/DRESSINGS) ×9 IMPLANT
ELECT REM PT RETURN 9FT ADLT (ELECTROSURGICAL) ×3
ELECTRODE REM PT RTRN 9FT ADLT (ELECTROSURGICAL) ×2 IMPLANT
GAUZE SPONGE 2X2 8PLY STRL LF (GAUZE/BANDAGES/DRESSINGS) ×2 IMPLANT
GAUZE SPONGE 4X4 16PLY XRAY LF (GAUZE/BANDAGES/DRESSINGS) ×3 IMPLANT
GLOVE ECLIPSE 8.0 STRL XLNG CF (GLOVE) ×6 IMPLANT
GLOVE INDICATOR 8.0 STRL GRN (GLOVE) ×6 IMPLANT
GOWN STRL REIN XL XLG (GOWN DISPOSABLE) ×12 IMPLANT
KIT BASIN OR (CUSTOM PROCEDURE TRAY) ×3 IMPLANT
LEGGING LITHOTOMY PAIR STRL (DRAPES) ×3 IMPLANT
NDL SAFETY ECLIPSE 18X1.5 (NEEDLE) IMPLANT
NEEDLE HYPO 18GX1.5 SHARP (NEEDLE)
PACK LITHOTOMY IV (CUSTOM PROCEDURE TRAY) ×3 IMPLANT
PENCIL BUTTON HOLSTER BLD 10FT (ELECTRODE) ×6 IMPLANT
PUMP PAIN ON-Q (MISCELLANEOUS) IMPLANT
RTRCTR WOUND ALEXIS 18CM MED (MISCELLANEOUS)
SCISSORS LAP 5X35 DISP (ENDOMECHANICALS) ×3 IMPLANT
SEALER TISSUE G2 CVD JAW 35 (ENDOMECHANICALS) ×2 IMPLANT
SEALER TISSUE G2 CVD JAW 45CM (ENDOMECHANICALS) ×1
SEALER TISSUE G2 STRG ARTC 35C (ENDOMECHANICALS) IMPLANT
SET IRRIG TUBING LAPAROSCOPIC (IRRIGATION / IRRIGATOR) ×3 IMPLANT
SLEEVE XCEL OPT CAN 5 100 (ENDOMECHANICALS) ×6 IMPLANT
SPONGE GAUZE 2X2 STER 10/PKG (GAUZE/BANDAGES/DRESSINGS) ×1
SPONGE GAUZE 4X4 12PLY (GAUZE/BANDAGES/DRESSINGS) ×3 IMPLANT
SPONGE LAP 18X18 X RAY DECT (DISPOSABLE) ×6 IMPLANT
STAPLER CIRC ILS CVD 33MM 37CM (STAPLE) ×3 IMPLANT
STAPLER CUT CVD 40MM BLUE (STAPLE) ×3 IMPLANT
STAPLER VISISTAT 35W (STAPLE) ×3 IMPLANT
SUCTION POOLE TIP (SUCTIONS) ×3 IMPLANT
SUT CHROMIC 2 0 SH (SUTURE) IMPLANT
SUT CHROMIC 3 0 SH 27 (SUTURE) IMPLANT
SUT MNCRL AB 4-0 PS2 18 (SUTURE) ×3 IMPLANT
SUT PDS AB 1 CTX 36 (SUTURE) IMPLANT
SUT PDS AB 1 TP1 96 (SUTURE) IMPLANT
SUT PROLENE 0 CT 2 (SUTURE) IMPLANT
SUT SILK 2 0 (SUTURE) ×2
SUT SILK 2 0 SH CR/8 (SUTURE) ×3 IMPLANT
SUT SILK 2-0 18XBRD TIE 12 (SUTURE) ×2 IMPLANT
SUT SILK 3 0 (SUTURE) ×3
SUT SILK 3 0 SH CR/8 (SUTURE) ×3 IMPLANT
SUT SILK 3-0 18XBRD TIE 12 (SUTURE) ×2 IMPLANT
SUT VIC AB 2-0 SH 27 (SUTURE)
SUT VIC AB 2-0 SH 27X BRD (SUTURE) IMPLANT
SUT VIC AB 4-0 SH 18 (SUTURE) IMPLANT
SYS LAPSCP GELPORT 120MM (MISCELLANEOUS) ×3
SYSTEM LAPSCP GELPORT 120MM (MISCELLANEOUS) ×2 IMPLANT
TAPE UMBILICAL COTTON 1/8X30 (MISCELLANEOUS) IMPLANT
TOWEL OR 17X26 10 PK STRL BLUE (TOWEL DISPOSABLE) ×6 IMPLANT
TOWEL OR NON WOVEN STRL DISP B (DISPOSABLE) ×6 IMPLANT
TRAY FOLEY CATH 14FRSI W/METER (CATHETERS) ×3 IMPLANT
TRAY LAP CHOLE (CUSTOM PROCEDURE TRAY) ×3 IMPLANT
TROCAR BLADELESS OPT 5 100 (ENDOMECHANICALS) ×3 IMPLANT
TROCAR XCEL NON-BLD 11X100MML (ENDOMECHANICALS) ×3 IMPLANT
TUBING FILTER THERMOFLATOR (ELECTROSURGICAL) ×3 IMPLANT
TUNNELER SHEATH ON-Q 16GX12 DP (PAIN MANAGEMENT) IMPLANT
YANKAUER SUCT BULB TIP 10FT TU (MISCELLANEOUS) ×6 IMPLANT

## 2013-01-31 NOTE — H&P (Signed)
Keith Schmitt  1960-07-12 [id number]  CARE TEAM:  PCP: Marga Melnick, MD  Outpatient Care Team: Patient Care Team: [name], MD as PCP - General [name], MD as Consulting Physician (General Surgery) [name], MD as Consulting Physician (Gastroenterology)  Inpatient Treatment Team: Treatment Team: Attending Provider: [name], MD  This patient is a 52 y.o.male who presents today for surgical evaluation.  Pleasant active male. Has had episodes of intermittent left lower quadrant pressure/pain for years. Usually mild and not intense. However, this year he began to have attacks every month. Became more intense. More severe. Pain would last up to a week. Diagnosed with diverticulitis in February. Improved with antibiotics. Recurred. Back on antibiotics. Had colonoscopy in April showing diverticulosis of moderate severity and left colon. Nothing else. He keeps getting recurrent attacks despite switching to a bland diet. Because of a more chronic pain and recurrent attacks, surgical consultation requested.   Patient normally has a bowel movement every morning. However, he now gets 4-5 frequent bowel movements and urgency. No blood. Had open appendectomy but no other surgeries. He swims a mile three times a week and rides his bicycle almost on a daily basis. No exertional chest/neck/shoulder/arm pain. No personal nor family history of GI/colon cancer, inflammatory bowel disease, irritable bowel syndrome, allergy such as Celiac Sprue, dietary/dairy problems, colitis, ulcers nor gastritis. No recent sick contacts/gastroenteritis. No travel outside the country. No changes in diet.    Past Medical History  Diagnosis Date  . History of Osgood-Schlatter disease   . Hyperlipidemia     high "bad cholesterol"  . NEVUS, ATYPICAL   . UNEQUAL LEG LENGTH   . Abnormality of gait   . Elbow pain, left     left elbow pain intermittent "0-5"'  . Stress fracture of left tibia  with delayed healing     shin area left knee-is improved  . Heel pain 01-23-13    left heel pain level "0-3"  . Diverticulosis of colon (without mention of hemorrhage)   . Colon polyp     Tubular Adenoma   . Diverticulitis   . Rotator cuff tendonitis     no problem now in2 months- left shoulder    Past Surgical History  Procedure Laterality Date  . Surgery r knee post trauma  1988    "bone fragment"  . Appendectomy  1999    Dr Gerrit Friends  . Vasectomy  1998  . Refractive surgery  2003     Dr Delaney Meigs    History   Social History  . Marital Status: Married    Spouse Name: N/A    Number of Children: N/A  . Years of Education: N/A   Occupational History  . Not on file.   Social History Main Topics  . Smoking status: Never Smoker   . Smokeless tobacco: Never Used  . Alcohol Use: 3.0 oz/week    5 Cans of beer per week     Comment: weekly  . Drug Use: No  . Sexual Activity: Yes   Other Topics Concern  . Not on file   Social History Narrative  . No narrative on file    Family History  Problem Relation Age of Onset  . Heart attack Father 61  . Alcohol abuse Father   . Heart attack Mother 31  . Alcohol abuse Mother   . Heart attack Paternal Uncle 61  . Cancer Neg Hx   . Diabetes Neg Hx   .  Stroke Neg Hx     Current Facility-Administered Medications  Medication Dose Route Frequency Provider Last Rate Last Dose  . bupivacaine 0.25 % ON-Q pump DUAL CATH 300 mL  300 mL Other Continuous [name], MD      . bupivacaine liposome (EXPAREL) 1.3 % injection 266 mg  20 mL Infiltration Once [name], MD      . cefoTEtan (CEFOTAN) 2 g in dextrose 5 % 50 mL IVPB  2 g Intravenous On Call to OR [name], MD      . clindamycin (CLEOCIN) 900 mg, gentamicin (GARAMYCIN) 240 mg in sodium chloride 0.9 % 1,000 mL for intraperitoneal lavage   Intraperitoneal To OR [name], MD       Facility-Administered Medications Ordered in Other Encounters  Medication  Dose Route Frequency Provider Last Rate Last Dose  . lactated ringers infusion    Continuous PRN Lattie Haw, CRNA         Allergies  Allergen Reactions  . Vytorin [Ezetimibe-Simvastatin]     Muscle pain    ROS: Constitutional:  No fevers, chills, sweats.  Weight stable Eyes:  No vision changes, No discharge HENT:  No sore throats, nasal drainage Lymph: No neck swelling, No bruising easily Pulmonary:  No cough, productive sputum CV: No orthopnea, PND  Patient walks 20 minutes for about 1 miles without difficulty.  No exertional chest/neck/shoulder/arm pain. GI:  No personal nor family history of GI/colon cancer, inflammatory bowel disease, irritable bowel syndrome, allergy such as Celiac Sprue, dietary/dairy problems, colitis, ulcers nor gastritis.  No recent sick contacts/gastroenteritis.  No travel outside the country.  No changes in diet. Renal: No UTIs, No hematuria Genital:  No drainage, bleeding, masses Musculoskeletal: No severe joint pain.  Good ROM major joints Skin:  No sores or lesions.  No rashes Heme/Lymph:  No easy bleeding.  No swollen lymph nodes Neuro: No focal weakness/numbness.  No seizures Psych: No suicidal ideation.  No hallucinations  BP 129/84  Pulse 75  Temp(Src) 97.7 F (36.5 C) (Oral)  Resp 18  SpO2 100%  Physical Exam: General: Pt awake/alert/oriented x4 in no major acute distress Eyes: PERRL, normal EOM. Sclera nonicteric Neuro: CN II-XII intact w/o focal sensory/motor deficits. Lymph: No head/neck/groin lymphadenopathy Psych:  No delerium/psychosis/paranoia.  Anxious but consolable HENT: Normocephalic, Mucus membranes moist.  No thrush Neck: Supple, No tracheal deviation Chest: No pain.  Good respiratory excursion. CV:  Pulses intact.  Regular rhythm Abdomen: Soft, Nondistended.  Mild lower abdominal soreness.  Nontender.  No incarcerated hernias. Ext:  SCDs BLE.  No significant edema.  No cyanosis Skin: No petechiae /  purpurea.  No major sores Musculoskeletal: No severe joint pain.  Good ROM major joints   Results:   Labs: No results found for this or any previous visit (from the past 48 hour(s)).  Imaging / Studies: No results found.  Medications / Allergies: per chart  Antibiotics: Anti-infectives   Start     Dose/Rate Route Frequency Ordered Stop   01/31/13 0600  clindamycin (CLEOCIN) 900 mg, gentamicin (GARAMYCIN) 240 mg in sodium chloride 0.9 % 1,000 mL for intraperitoneal lavage    Comments:  Pharmacy may adjust dosing strength, schedule, rate of infusion, etc as needed to optimize therapy    Intraperitoneal To Surgery 01/30/13 1810 02/01/13 0600   01/31/13 0528  cefoTEtan (CEFOTAN) 2 g in dextrose 5 % 50 mL IVPB     2 g 100 mL/hr over 30 Minutes Intravenous  On call to O.R. 01/31/13 0528 02/01/13 0559      Assessment  [name]  52 y.o. male  Day of Surgery  Procedure(s): LAPAROSCOPIC PARTIAL COLECTOMY/POSSIBLE OPEN RIGID PROCTOSCOPY/POSSIBLE SPLENI FLEXURE MOBLIZATION  Problem List:  Active Problems:   * No active hospital problems. *   Persistent episodes of recurrent diverticulitis, progressed to chronic diverticulitis on chronic antibiotics.  Plan:  He requires colectomy.  Reasonable to start out laparoscopically:  The anatomy & physiology of the digestive tract was discussed.  The pathophysiology was discussed.  Natural history risks without surgery was discussed.   I feel the risks of no intervention will lead to serious problems that outweigh the operative risks; therefore, I recommended a partial colectomy to remove the pathology.  Laparoscopic & open techniques were discussed.   Risks such as bleeding, infection, abscess, leak, reoperation, possible ostomy, hernia, heart attack, death, and other risks were discussed.  I noted a good likelihood this will help address the problem.   Goals of post-operative recovery were discussed as well.  We will work to minimize  complications.  An educational handout on the pathology was given as well.  Questions were answered.  The patient expresses understanding & wishes to proceed with surgery.   -VTE prophylaxis- SCDs, etc  -mobilize as tolerated to help recovery    [name], M.D., F.A.C.S. Gastrointestinal and Minimally Invasive Surgery [facility] [address] [place], [place] [address] [phone] Main / Paging   01/31/2013

## 2013-01-31 NOTE — Op Note (Signed)
01/31/2013  10:14 AM  PATIENT:  Keith Schmitt  52 y.o. male  Patient Care Team: [name], MD as PCP - General [name], MD as Consulting Physician (General Surgery) [name], MD as Consulting Physician (Gastroenterology)  PRE-OPERATIVE DIAGNOSIS:  recurrent diverticulitis of descending/sigmoid colon  POST-OPERATIVE DIAGNOSIS:  recurrent diverticulitis of descending/sigmoid colon  PROCEDURE:  Procedure(s): LAPAROSCOPIC PARTIAL SIGMOIDCOLECTOMY RIGID PROCTOSCOPY  SURGEON:  Surgeon(s): [name], MD Kandis Cocking, MD - Assist  ANESTHESIA:   local and general  EBL:  Total I/O In: 1950 [I.V.:1950] Out: 175 [Urine:150; Blood:25]  Delay start of Pharmacological VTE agent (>24hrs) due to surgical blood loss or risk of bleeding:  no  DRAINS: none   SPECIMEN:  Source of Specimen:  Descnding/sigmoid colon with anastomotic rings  DISPOSITION OF SPECIMEN:  PATHOLOGY  COUNTS:  YES  PLAN OF CARE: Admit to inpatient   PATIENT DISPOSITION:  PACU - hemodynamically stable.  INDICATION:    Patient with recurrent episodes of sigmoid and descending colon diverticulitis.  I recommended segmental resection:  The anatomy & physiology of the digestive tract was discussed.  The pathophysiology was discussed.  Natural history risks without surgery was discussed.   I worked to give an overview of the disease and the frequent need to have multispecialty involvement.  I feel the risks of no intervention will lead to serious problems that outweigh the operative risks; therefore, I recommended a partial colectomy to remove the pathology.  Laparoscopic & open techniques were discussed.   Risks such as bleeding, infection, abscess, leak, reoperation, possible ostomy, hernia, heart attack, death, and other risks were discussed.  I noted a good likelihood this will help address the problem.   Goals of post-operative recovery were discussed as well.  We will work to  minimize complications.  Educational materials on the pathology had been given in the office.  Questions were answered.    The patient expressed understanding & wished to proceed with surgery.  OR FINDINGS:   Patient had Inflammation of his distal descending colon and most of his redundant sigmoid colon.  Interloop sigmoid adhesions with mesenteric inflammation  No obvious metastatic disease on visceral parietal peritoneum or liver.  The anastomosis rests 15 cm from the anal verge by rigid proctoscopy.  DESCRIPTION:   Informed consent was confirmed.  The patient underwent general anaesthesia without difficulty.  The patient was positioned appropriately.  VTE prevention in place.  The patient's abdomen was clipped, prepped, & draped in a sterile fashion.  Surgical timeout confirmed our plan.  The patient was positioned in reverse Trendelenburg.  Abdominal entry was gained using optical entry technique in the right upper abdomen.  Entry was clean.  I induced carbon dioxide insufflation.  Camera inspection revealed no injury.  Extra ports were carefully placed under direct laparoscopic visualization.  I reflected the greater omentum and the upper abdomen the small bowel in the upper abdomen. I scored the base of peritoneum of the right side of the mesentery of the left colon from the ligament of Treitz to the peritoneal rectal reflection.   The patient had And moderate inflammation at the descending/sigmoid colon.  Distal sigmoid colon quite redundant and folded upon itself.  I elevated the sigmoid mesentery and got into the retro-mesenteric plane. We were able to identify the left ureter and gonadal vessels. We kept those posterior within the retroperitoneum and elevated the left colon mesentery off that. I did free off of mesentery to the IMA  pedicle but did not ligate it yet.  I continued distally and got into the avascular plane posterior to the mesorectum. This allowed me to help mobilize the  rectum as well by freeing the mesorectum off the sacrum.  I mobilized the peritoneal coverings to the peritoneal reflection on both the right and left sides of the mid rectum.  I could see the right and left ureters and stayed away from them.  I kept in the lateral vascular pedicles to the rectum intact.  I skeletonized the lymph nodes off the inferior mesenteric artery pedicle.  I went down to its takeoff from the aorta.  I isolated the inferior mesenteric vein off of the ligament of Treitz just cephalad to that as well.  After confirming the left ureter was out of the way, I went ahead and ligated the inferior mesenteric artery pedicle with bipolar EnSeal just near its takeoff from the aorta.  I did ligate the inferior mesenteric vein in a similar fashion Just superior to the ligament of Treitz, staying away from the jejunum and being careful.  We ensured hemostasis. I did further medial to lateral dissection all the way up to the splenic flexure.  I then mobilized the left colon in a lateral to medial fashion starting at the splenic flexure and coming down to the mid rectum.  This provided excellent mobility.  I skeletonized the mesorectum at the junction at the proximal rectum using blunt dissection & bipolar EnSeal.  I mobilized the left colon some more to ensure good mobilization of the left colon to reach into the pelvis.  I placed a GelPort has a wound protector through a Pfannenstiel incision in the suprapubic region, taking care to avoid bladder injury. I transected bowel at the junction between the proximal and mid rectum using a contour stapler. I was able to eviscerate the rectosigmoid and descending colon out the wound.  I chose a region at the descending/sigmoid junction that was soft and easily reached down. I clamped the colon proximal to this area using a soft bowel clamp. I transected at the descending/sigmoid junction with a scalpel. I got healthy bleeding mucosa. I transected the remaining  specimen mesentery in a radial fashion to preserve good blood supply to the proximal colon end.  We sent the rectosigmoid colon specimen off to go to pathology. We sized the colon orifice.  I chose a 33 EEA anvil stapler system. I placed the anvil to the open end of the descending colon and closed around it using a 0 Prolene pursestring.  We did copious irrigation with crystalloid solution.  Hemostasis was good.      Dr. Ezzard Standing scrubbed down and did gentle anal dilation and advanced the EEA sizers sequentially & then the EEA stapler up the rectal stump. The spike was brought out at the provimal end of the rectal stump under direct visualization.  I attached the anvil of the proximal colon the spike of the stapler. Anvil was tightened down and held clamped for 60 seconds. The EEA stapler was fired and held clamped for 30 seconds. The stapler was released & removed. We noted 2 excellent anastomotic rings. Blue stitch is in the proximal ring.  Dr. Ezzard Standing did rigid proctoscopy noted the anastomosis was at 15 cm from the anal verge consistent with the proximal rectum.  We did a final irrigation of antibiotic solution (900 mg clindamycin/240 mg gentamicin in a liter of crystalloid) & held that for the pelvic air leak test .  The  rectum was insufflated the rectum while clamping the colon proximal to that anastomosis.  There was a negative air leak test. There was no tension. Anastomosis & colon looked viable.    We changed gloves.  We did diagnostic laparoscopy.  We aspirated the antibiotic irrigation.  Hemostasis was good.   Ureters & bowel uninjured.  The anastomosis looked healthy.  Removed all ports and wound protector is in the skin.  The patient was reprepped and draped with new sterile instruments per the colon pathway protocol.  We changed to gown and gloves.  I placed On-Q catheter and sheaths into the preperitoneal space under direct palpation.  I removed CO2 gas out through the ports.  I closed the 5mm port  sites using Monocryl stitch and sterile dressing.  I closed the Pfannenstiel wound using a 0 Vicryl vertical peritoneal closure and a #1 PDS transverse anterior rectal fascial closure. I closed the skin with some interrupted Monocryl stitches. I placed antibiotic-soaked wicks into the closure at the corners & centrally x4 between those areas. I placed a sterile dressing.  OnQ catheters placed & sheaths peeled away.  Patient is being extubated go to recovery room. I discussed postop care with the patient in detail the office & in the holding area. Instructions are written. I updated the patient's status to the patient's wife.  Recommendations were made.  Questions were answered.  She expressed understanding & appreciation.

## 2013-01-31 NOTE — Anesthesia Procedure Notes (Signed)
Procedure Name: Intubation Date/Time: 01/31/2013 7:25 AM Performed by: Edison Pace Pre-anesthesia Checklist: Patient identified, Timeout performed, Emergency Drugs available, Suction available and Patient being monitored Patient Re-evaluated:Patient Re-evaluated prior to inductionOxygen Delivery Method: Circle system utilized Preoxygenation: Pre-oxygenation with 100% oxygen Intubation Type: Cricoid Pressure applied and IV induction Ventilation: Mask ventilation without difficulty Laryngoscope Size: Mac and 4 Grade View: Grade II Tube type: Oral Tube size: 7.5 mm Number of attempts: 1 (tight tmj decreased opening. no pop) Airway Equipment and Method: Stylet Placement Confirmation: ETT inserted through vocal cords under direct vision,  breath sounds checked- equal and bilateral and positive ETCO2 Secured at: 21 cm Tube secured with: Tape Dental Injury: Teeth and Oropharynx as per pre-operative assessment

## 2013-01-31 NOTE — Preoperative (Addendum)
Beta Blockers   Reason not to administer Beta Blockers:Not Applicable 

## 2013-01-31 NOTE — Anesthesia Postprocedure Evaluation (Signed)
  Anesthesia Post-op Note  Patient: Keith Schmitt  Procedure(s) Performed: Procedure(s) (LRB): LAPAROSCOPIC PARTIAL SIGMOIDCOLECTOMY (N/A) RIGID PROCTOSCOPY (N/A)  Patient Location: PACU  Anesthesia Type: General  Level of Consciousness: awake and alert   Airway and Oxygen Therapy: Patient Spontanous Breathing  Post-op Pain: mild  Post-op Assessment: Post-op Vital signs reviewed, Patient's Cardiovascular Status Stable, Respiratory Function Stable, Patent Airway and No signs of Nausea or vomiting  Last Vitals:  Filed Vitals:   01/31/13 1213  BP: 132/64  Pulse: 63  Temp: 36.6 C  Resp: 14    Post-op Vital Signs: stable   Complications: No apparent anesthesia complications

## 2013-01-31 NOTE — Transfer of Care (Signed)
Immediate Anesthesia Transfer of Care Note  Patient: Keith Schmitt  Procedure(s) Performed: Procedure(s): LAPAROSCOPIC PARTIAL SIGMOIDCOLECTOMY (N/A) RIGID PROCTOSCOPY (N/A)  Patient Location: PACU  Anesthesia Type:General  Level of Consciousness: awake, oriented, patient cooperative, lethargic and responds to stimulation  Airway & Oxygen Therapy: Patient Spontanous Breathing and Patient connected to face mask oxygen  Post-op Assessment: Report given to PACU RN, Post -op Vital signs reviewed and stable and Patient moving all extremities  Post vital signs: Reviewed and stable  Complications: No apparent anesthesia complications

## 2013-02-01 ENCOUNTER — Encounter (HOSPITAL_COMMUNITY): Payer: Self-pay | Admitting: Surgery

## 2013-02-01 LAB — BASIC METABOLIC PANEL
BUN: 15 mg/dL (ref 6–23)
CO2: 23 mEq/L (ref 19–32)
Calcium: 8.6 mg/dL (ref 8.4–10.5)
Creatinine, Ser: 1.21 mg/dL (ref 0.50–1.35)
GFR calc Af Amer: 77 mL/min — ABNORMAL LOW (ref >90)
GFR calc non Af Amer: 67 mL/min — ABNORMAL LOW (ref >90)
Glucose, Bld: 137 mg/dL — ABNORMAL HIGH (ref 70–99)

## 2013-02-01 LAB — CBC
Hemoglobin: 13.5 g/dL (ref 13.0–17.0)
MCH: 31.3 pg (ref 26.0–34.0)
MCHC: 35.5 g/dL (ref 30.0–36.0)
MCV: 88.2 fL (ref 78.0–100.0)
Platelets: 205 10*3/uL (ref 150–400)
RDW: 12 % (ref 11.5–15.5)

## 2013-02-01 NOTE — Care Management Note (Signed)
    Page 1 of 1   02/01/2013     11:18:30 AM   CARE MANAGEMENT NOTE 02/01/2013  Patient:  Keith Schmitt, Keith Schmitt   Account Number:  [id number]  Date Initiated:  02/01/2013  Documentation initiated by:  Lorenda Ishihara  Subjective/Objective Assessment:   52 yo male admitted s/p colectomy for diverticulitis. PTA lived at home with spouse.     Action/Plan:   Home when stable   Anticipated DC Date:  02/04/2013   Anticipated DC Plan:  HOME/SELF CARE      DC Planning Services  CM consult      Choice offered to / List presented to:             Status of service:  Completed, signed off Medicare Important Message given?   (If response is "NO", the following Medicare IM given date fields will be blank) Date Medicare IM given:   Date Additional Medicare IM given:    Discharge Disposition:  HOME/SELF CARE  Per UR Regulation:  Reviewed for med. necessity/level of care/duration of stay  If discussed at Long Length of Stay Meetings, dates discussed:    Comments:

## 2013-02-01 NOTE — Progress Notes (Signed)
Keith Schmitt [id number] [birth year]  CARE TEAM:  PCP: Keith Melnick, MD  Outpatient Care Team: Patient Care Team: Keith Lawless, MD as PCP - General Keith Sportsman, MD as Consulting Physician (General Surgery) Keith Layman, MD as Consulting Physician (Gastroenterology)  Inpatient Treatment Team: Treatment Team: Attending Provider: [name], MD; Registered Nurse: Keith Bellows, RN   Subjective:  Walking in hallways Foley irritating at times Pain controlled No n/v  Objective:  Vital signs:  Filed Vitals:   01/31/13 1817 01/31/13 2138 02/01/13 0144 02/01/13 0523  BP: 122/70 107/56 91/53 108/58  Pulse: 82 96 49 51  Temp: 98.3 F (36.8 C) 99.2 F (37.3 C) 98.1 F (36.7 C) 98.8 F (37.1 C)  TempSrc: Oral Oral Oral Oral  Resp: 16 16 16 16   Height:      Weight:    196 lb 3.4 oz (89 kg)  SpO2: 96% 96% 95% 95%    Last BM Date: 01/30/13  Intake/Output   Yesterday:  10/02 0701 - 10/03 0700 In: 3765 [P.O.:240; I.V.:3525] Out: 975 [Urine:950; Blood:25] This shift:  Total I/O In: 440 [P.O.:240; I.V.:200] Out: 300 [Urine:300]  Bowel function:  Flatus: n  BM: n  Drain: n/a  Physical Exam:  General: Pt awake/alert/oriented x4 in no acute distress Eyes: PERRL, normal EOM.  Sclera clear.  No icterus Neuro: CN II-XII intact w/o focal sensory/motor deficits. Lymph: No head/neck/groin lymphadenopathy Psych:  No delerium/psychosis/paranoia HENT: Normocephalic, Mucus membranes moist.  No thrush Neck: Supple, No tracheal deviation Chest: No chest wall pain w good excursion CV:  Pulses intact.  Regular rhythm MS: Normal AROM mjr joints.  No obvious deformity Abdomen: Soft.  Nondistended.  Mildly tender at incisions only.  No evidence of peritonitis.  OnQ in place.  No incarcerated hernias. Ext:  SCDs BLE.  No mjr edema.  No cyanosis Skin: No petechiae / purpura   Problem List:   Principal Problem:   Diverticulitis -  recurrent   Assessment  Keith Schmitt  52 y.o. male  1 Day Post-Op  Procedure(s): LAPAROSCOPIC PARTIAL SIGMOIDCOLECTOMY RIGID PROCTOSCOPY  Recovering  Plan:  -liquids & ADAT per protocol -Cr OK  - 1.2 is baseline for him. -f/u path -VTE prophylaxis- SCDs, etc -mobilize as tolerated to help recovery  I updated the patient's status to the patient including OR findings/procedure.  Recommendations were made.  Questions were answered.  The patient expressed understanding & appreciation.   Keith Schmitt, M.D., F.A.C.S. Gastrointestinal and Minimally Invasive Surgery [facility] [address], [address] [place], [place] [address] [phone] Main / Paging   02/01/2013   Results:   Labs: Results for orders placed during the hospital encounter of 01/31/13 (from the past 48 hour(s))  BASIC METABOLIC PANEL     Status: Abnormal   Collection Time    02/01/13  4:38 AM      Result Value Range   Sodium 136  135 - 145 mEq/L   Potassium 4.7  3.5 - 5.1 mEq/L   Chloride 103  96 - 112 mEq/L   CO2 23  19 - 32 mEq/L   Glucose, Bld 137 (*) 70 - 99 mg/dL   BUN 15  6 - 23 mg/dL   Creatinine, Ser 5.64  0.50 - 1.35 mg/dL   Calcium 8.6  8.4 - 33.2 mg/dL   GFR calc non Af Amer 67 (*) >90 mL/min   GFR calc Af Amer 77 (*) >90 mL/min   Comment: (  NOTE)     The eGFR has been calculated using the CKD EPI equation.     This calculation has not been validated in all clinical situations.     eGFR's persistently <90 mL/min signify possible Chronic Kidney     Disease.  CBC     Status: Abnormal   Collection Time    02/01/13  4:38 AM      Result Value Range   WBC 15.3 (*) 4.0 - 10.5 K/uL   RBC 4.31  4.22 - 5.81 MIL/uL   Hemoglobin 13.5  13.0 - 17.0 g/dL   HCT 96.0 (*) 45.4 - 09.8 %   MCV 88.2  78.0 - 100.0 fL   MCH 31.3  26.0 - 34.0 pg   MCHC 35.5  30.0 - 36.0 g/dL   RDW 11.9  14.7 - 82.9 %   Platelets 205  150 - 400 K/uL  MAGNESIUM     Status: None    Collection Time    02/01/13  4:38 AM      Result Value Range   Magnesium 2.0  1.5 - 2.5 mg/dL    Imaging / Studies: No results found.  Medications / Allergies: per chart  Antibiotics: Anti-infectives   Start     Dose/Rate Route Frequency Ordered Stop   01/31/13 1800  cefoTEtan (CEFOTAN) 2 g in dextrose 5 % 50 mL IVPB     2 g 100 mL/hr over 30 Minutes Intravenous Every 12 hours 01/31/13 1302 01/31/13 1749   01/31/13 0600  clindamycin (CLEOCIN) 900 mg, gentamicin (GARAMYCIN) 240 mg in sodium chloride 0.9 % 1,000 mL for intraperitoneal lavage  Status:  Discontinued    Comments:  Pharmacy may adjust dosing strength, schedule, rate of infusion, etc as needed to optimize therapy    Intraperitoneal To Surgery 01/30/13 1810 01/31/13 1240   01/31/13 0528  cefoTEtan (CEFOTAN) 2 g in dextrose 5 % 50 mL IVPB  Status:  Discontinued     2 g 100 mL/hr over 30 Minutes Intravenous On call to O.R. 01/31/13 5621 01/31/13 1240

## 2013-02-02 MED ORDER — OXYCODONE-ACETAMINOPHEN 5-325 MG PO TABS: 1.0000 | ORAL_TABLET | ORAL | Status: DC | PRN

## 2013-02-02 NOTE — Progress Notes (Signed)
Keith Schmitt [id number] [birth year]  CARE TEAM:  PCP: Marga Melnick, MD  Outpatient Care Team: Patient Care Team: [name], MD as PCP - General [name], MD as Consulting Physician (General Surgery) [name], MD as Consulting Physician (Gastroenterology)  Inpatient Treatment Team: Treatment Team: Attending Provider: [name], MD; Registered Nurse: Jonathon Bellows, RN; Registered Nurse: Sydell Axon, RN; Registered Nurse: Daleen Snook, COUNS; Technician: Lynden Ang, NT; Registered Nurse: Vivien Rossetti, RN; Registered Nurse: Uvaldo Rising Rylova, Student-RN   Subjective:  Walking in hallways, urinating well Pain controlled No n/v Having BM's  Objective:  Vital signs:  Filed Vitals:   02/01/13 1400 02/01/13 2100 02/02/13 0530 02/02/13 0938  BP: 115/72 128/72 126/66 128/80  Pulse: 50 52 57 54  Temp: 98.3 F (36.8 C) 97.9 F (36.6 C) 98 F (36.7 C)   TempSrc: Axillary Oral Oral   Resp: 18 18 16 21   Height:      Weight:   197 lb 8.5 oz (89.6 kg)   SpO2: 100% 94% 96% 96%    Last BM Date: 01/30/13  Intake/Output   Yesterday:  10/03 0701 - 10/04 0700 In: 1200 [I.V.:1200] Out: 2350 [Urine:2350] This shift:  Total I/O In: -  Out: 450 [Urine:450]  Bowel function:  Flatus: Y  BM: Y  Drain: n/a  Physical Exam:  General: Pt awake/alert/oriented x4 in no acute distress CV:  Pulses intact.  Regular rhythm Abdomen: Soft.  Nondistended.  Mildly tender at incisions only.  No evidence of peritonitis.  OnQ in place.  No incarcerated hernias. Ext:  SCDs BLE.  No mjr edema.  No cyanosis Skin: No petechiae / purpura   Problem List:   Principal Problem:   Diverticulitis - recurrent - s/p lap left colectomy 01/31/2013   Assessment  Keith Schmitt  52 y.o. male  2 Days Post-Op  Procedure(s): LAPAROSCOPIC PARTIAL SIGMOIDCOLECTOMY RIGID PROCTOSCOPY  Recovering  Plan:  - cont reg diet -f/u  path -VTE prophylaxis- SCDs, etc -mobilize as tolerated to help recovery -will d/c on Q and start PO narcotics -Probably home in AM  [name], MD  Colorectal and General Surgery [facility]  02/02/2013   Results:   Labs: Results for orders placed during the hospital encounter of 01/31/13 (from the past 48 hour(s))  BASIC METABOLIC PANEL     Status: Abnormal   Collection Time    02/01/13  4:38 AM      Result Value Range   Sodium 136  135 - 145 mEq/L   Potassium 4.7  3.5 - 5.1 mEq/L   Chloride 103  96 - 112 mEq/L   CO2 23  19 - 32 mEq/L   Glucose, Bld 137 (*) 70 - 99 mg/dL   BUN 15  6 - 23 mg/dL   Creatinine, Ser 4.09  0.50 - 1.35 mg/dL   Calcium 8.6  8.4 - 81.1 mg/dL   GFR calc non Af Amer 67 (*) >90 mL/min   GFR calc Af Amer 77 (*) >90 mL/min   Comment: (NOTE)     The eGFR has been calculated using the CKD EPI equation.     This calculation has not been validated in all clinical situations.     eGFR's persistently <90 mL/min signify possible Chronic Kidney     Disease.  CBC     Status: Abnormal   Collection Time    02/01/13  4:38 AM      Result  Value Range   WBC 15.3 (*) 4.0 - 10.5 K/uL   RBC 4.31  4.22 - 5.81 MIL/uL   Hemoglobin 13.5  13.0 - 17.0 g/dL   HCT 96.0 (*) 45.4 - 09.8 %   MCV 88.2  78.0 - 100.0 fL   MCH 31.3  26.0 - 34.0 pg   MCHC 35.5  30.0 - 36.0 g/dL   RDW 11.9  14.7 - 82.9 %   Platelets 205  150 - 400 K/uL  MAGNESIUM     Status: None   Collection Time    02/01/13  4:38 AM      Result Value Range   Magnesium 2.0  1.5 - 2.5 mg/dL    Imaging / Studies: No results found.  Medications / Allergies: per chart  Antibiotics: Anti-infectives   Start     Dose/Rate Route Frequency Ordered Stop   01/31/13 1800  cefoTEtan (CEFOTAN) 2 g in dextrose 5 % 50 mL IVPB     2 g 100 mL/hr over 30 Minutes Intravenous Every 12 hours 01/31/13 1302 01/31/13 1749   01/31/13 0600  clindamycin (CLEOCIN) 900 mg, gentamicin (GARAMYCIN) 240 mg in  sodium chloride 0.9 % 1,000 mL for intraperitoneal lavage  Status:  Discontinued    Comments:  Pharmacy may adjust dosing strength, schedule, rate of infusion, etc as needed to optimize therapy    Intraperitoneal To Surgery 01/30/13 1810 01/31/13 1240   01/31/13 0528  cefoTEtan (CEFOTAN) 2 g in dextrose 5 % 50 mL IVPB  Status:  Discontinued     2 g 100 mL/hr over 30 Minutes Intravenous On call to O.R. 01/31/13 5621 01/31/13 1240

## 2013-02-03 MED ORDER — OXYCODONE-ACETAMINOPHEN 5-325 MG PO TABS: 1.0000 | ORAL_TABLET | ORAL | Status: DC | PRN

## 2013-02-03 NOTE — Progress Notes (Signed)
Discharge instructions explained to patient; copy of discharge instructions given to patient along with prescriptions for oxycodone and percocet; pt verbalized understanding instructions.

## 2013-02-03 NOTE — Discharge Summary (Signed)
Physician Discharge Summary  Patient ID: Keith Schmitt MRN: [id number] DOB/AGE: 1960-10-14 52 y.o.  Admit date: 01/31/2013 Discharge date: 02/03/2013  Admission Diagnoses: Diverticulitis  Discharge Diagnoses:  Principal Problem:   Diverticulitis - recurrent - s/p lap left colectomy 01/31/2013   Discharged Condition: good  Hospital Course: The patient did very well after surgery.  Diet advanced as tolerated.  Pain controlled with oral narcotics by POD 3.  Pt having BM's by POD 2.    Consults: None  Significant Diagnostic Studies: labs: cbc, bmp  Treatments: IV hydration, analgesia: acetaminophen w/ codeine and surgery: L colectomy  Discharge Exam: Blood pressure 134/78, pulse 50, temperature 98.1 F (36.7 C), temperature source Oral, resp. rate 16, height 6' (1.829 m), weight 192 lb 10.9 oz (87.4 kg), SpO2 97.00%. General appearance: alert and cooperative GI: normal findings: soft, non-tender Incision/Wound: clean, dry, intact  Disposition:    Future Appointments Provider Department Dept Phone   02/18/2013 8:30 AM [name], MD [facility], [place] [phone]       Medication List         naproxen 500 MG tablet  Commonly known as:  NAPROSYN  Take 1 tablet (500 mg total) by mouth 2 (two) times daily with a meal.     oxyCODONE 5 MG immediate release tablet  Commonly known as:  Oxy IR/ROXICODONE  Take 1-2 tablets (5-10 mg total) by mouth every 4 (four) hours as needed for pain.     oxyCODONE-acetaminophen 5-325 MG per tablet  Commonly known as:  PERCOCET/ROXICET  Take 1-2 tablets by mouth every 4 (four) hours as needed.           Follow-up Information   Follow up with GROSS,STEVEN C., MD. Schedule an appointment as soon as possible for a visit in 2 weeks.   Specialty:  General Surgery   Contact information:   [address] [place] [address] [phone]       Signed: [name] 02/03/2013, 8:47 AM

## 2013-02-05 ENCOUNTER — Telehealth (INDEPENDENT_AMBULATORY_CARE_PROVIDER_SITE_OTHER): Payer: Self-pay

## 2013-02-05 NOTE — Telephone Encounter (Signed)
Patient called back at this time.  Patient given path report and states understanding.  Patient reports no complications at this time and states he is doing great.

## 2013-02-05 NOTE — Telephone Encounter (Signed)
LMOM for pt to call me so I can give him the good news on his pathology report of it showing only diverticulitis no cancer per Dr Michaell Cowing. I just want to check on him since he got discharged yesterday from the hospital also.

## 2013-02-18 ENCOUNTER — Encounter (INDEPENDENT_AMBULATORY_CARE_PROVIDER_SITE_OTHER): Payer: Self-pay

## 2013-02-18 ENCOUNTER — Encounter (INDEPENDENT_AMBULATORY_CARE_PROVIDER_SITE_OTHER): Payer: Self-pay | Admitting: Surgery

## 2013-02-18 ENCOUNTER — Ambulatory Visit (INDEPENDENT_AMBULATORY_CARE_PROVIDER_SITE_OTHER): Payer: 59 | Admitting: Surgery

## 2013-02-18 ENCOUNTER — Telehealth: Payer: Self-pay | Admitting: Physician Assistant

## 2013-02-18 VITALS — BP 120/70 | HR 66 | Temp 98.0°F | Resp 14 | Ht 72.0 in | Wt 188.4 lb

## 2013-02-18 DIAGNOSIS — K5732 Diverticulitis of large intestine without perforation or abscess without bleeding: Secondary | ICD-10-CM

## 2013-02-18 DIAGNOSIS — K5792 Diverticulitis of intestine, part unspecified, without perforation or abscess without bleeding: Secondary | ICD-10-CM

## 2013-02-18 NOTE — Progress Notes (Signed)
Subjective:     Patient ID: Keith Schmitt, male   DOB: August 15, 1960, 52 y.o.   MRN: [id number]  HPI  Keith Schmitt  [birth year] [id number]  Patient Care Team: [name], MD as PCP - General [name], MD as Consulting Physician (General Surgery) [name], MD as Consulting Physician (Gastroenterology)  Procedure (Date: 01/31/2013):    Diagnosis Colon, segmental resection, sigmoid - ACUTE DIVERTICULITIS AND DIVERTICULOSIS. - NO ATYPIA OR MALIGNANCY. - RESECTION MARGINS ARE VIABLE. [name] MD Pathologist, Electronic Signature (Case signed 02/01/2013)  This patient returns for surgical re-evaluation.  He is walking a lot.  6 miles a day.  Off narcotics.  Using Naprosyn only.  He does feel a little swelling above his Pfannenstiel incision.  It concerns him.  Wearing baggy pants.  He wants to get back to work, but is afraid that he is not ready yet.  No fevers or chills.  Bowels mostly regular.  No bleeding.  Energy level not back to normal but getting there.  No nausea or vomiting.  Wondering if she should avoid peanuts for the rest of his life  Patient Active Problem List   Diagnosis Date Noted  . Diverticulitis - recurrent - s/p lap left colectomy 01/31/2013 12/24/2012  . Chest pain 01/25/2012  . Heel pain 05/04/2011  . Rotator cuff tendonitis 12/14/2010  . Stress fracture of left tibia with delayed healing 11/02/2010  . Elbow pain, left 07/30/2010  . 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 ABNORMALITY OF GAIT 07/07/2010  . Basal cell cancer 04/24/2007  . HYPERLIPIDEMIA NEC/NOS 11/14/2006    Past Medical History  Diagnosis Date  . History of Osgood-Schlatter disease   . Hyperlipidemia     high "bad cholesterol"  . NEVUS, ATYPICAL   . UNEQUAL LEG LENGTH   . Abnormality of gait   . Elbow pain, left     left elbow pain intermittent "0-5"'  . Stress fracture of left tibia with delayed healing     shin area left knee-is improved  . Heel pain  01-23-13    left heel pain level "0-3"  . Diverticulosis of colon (without mention of hemorrhage)   . Colon polyp     Tubular Adenoma   . Diverticulitis   . Rotator cuff tendonitis     no problem now in2 months- left shoulder    Past Surgical History  Procedure Laterality Date  . Surgery r knee post trauma  1988    "bone fragment"  . Appendectomy  1999    Dr Gerrit Friends  . Vasectomy  1998  . Refractive surgery  2003     Dr Delaney Meigs  . Laparoscopic sigmoid colectomy  01/31/2013    Dr Michaell Cowing  . Laparoscopic partial colectomy N/A 01/31/2013    Procedure: LAPAROSCOPIC PARTIAL SIGMOIDCOLECTOMY;  Surgeon: [name], MD;  Location: [facility];  Service: General;  Laterality: N/A;  . Proctoscopy N/A 01/31/2013    Procedure: RIGID PROCTOSCOPY;  Surgeon: [name], MD;  Location: [facility];  Service: General;  Laterality: N/A;    History   Social History  . Marital Status: Married    Spouse Name: N/A    Number of Children: N/A  . Years of Education: N/A   Occupational History  . Not on file.   Social History Main Topics  . Smoking status: Never Smoker   . Smokeless tobacco: Never Used  . Alcohol Use: 3.0 oz/week    5 Cans of beer per  week     Comment: weekly  . Drug Use: No  . Sexual Activity: Yes   Other Topics Concern  . Not on file   Social History Narrative  . No narrative on file    Family History  Problem Relation Age of Onset  . Heart attack Father 44  . Alcohol abuse Father   . Heart attack Mother 35  . Alcohol abuse Mother   . Heart attack Paternal Uncle 21  . Cancer Neg Hx   . Diabetes Neg Hx   . Stroke Neg Hx     Current Outpatient Prescriptions  Medication Sig Dispense Refill  . naproxen (NAPROSYN) 500 MG tablet Take 1 tablet (500 mg total) by mouth 2 (two) times daily with a meal.  40 tablet  1   No current facility-administered medications for this visit.     Allergies  Allergen Reactions  . Vytorin [Ezetimibe-Simvastatin]     Muscle pain     BP 120/70  Pulse 66  Temp(Src) 98 F (36.7 C) (Temporal)  Resp 14  Ht 6' (1.829 m)  Wt 188 lb 6.4 oz (85.458 kg)  BMI 25.55 kg/m2  No results found.   Review of Systems  Constitutional: Negative for fever, chills and diaphoresis.  HENT: Negative for sore throat and trouble swallowing.   Eyes: Negative for photophobia and visual disturbance.  Respiratory: Negative for choking and shortness of breath.   Cardiovascular: Negative for chest pain and palpitations.       Walking several times a day.  Averaging 6 miles a day total.  Gastrointestinal: Negative for nausea, vomiting, abdominal distention, anal bleeding and rectal pain.  Genitourinary: Negative for dysuria, urgency, difficulty urinating and testicular pain.  Musculoskeletal: Negative for arthralgias, gait problem, myalgias and neck pain.  Skin: Negative for color change and rash.  Neurological: Negative for dizziness, speech difficulty, weakness and numbness.  Hematological: Negative for adenopathy.  Psychiatric/Behavioral: Negative for hallucinations, confusion and agitation.       Objective:   Physical Exam  Constitutional: He is oriented to person, place, and time. He appears well-developed and well-nourished. No distress.  HENT:  Head: Normocephalic.  Mouth/Throat: Oropharynx is clear and moist. No oropharyngeal exudate.  Eyes: Conjunctivae and EOM are normal. Pupils are equal, round, and reactive to light. No scleral icterus.  Neck: Normal range of motion. No tracheal deviation present.  Cardiovascular: Normal rate, normal heart sounds and intact distal pulses.   Pulmonary/Chest: Effort normal. No respiratory distress.  Abdominal: Soft. He exhibits no distension. There is no tenderness. Hernia confirmed negative in the right inguinal area and confirmed negative in the left inguinal area.  Incisions clean with normal healing ridges.  No hernias  Musculoskeletal: Normal range of motion. He exhibits no  tenderness.  Neurological: He is alert and oriented to person, place, and time. No cranial nerve deficit. He exhibits normal muscle tone. Coordination normal.  Skin: Skin is warm and dry. No rash noted. He is not diaphoretic.  Psychiatric: He has a normal mood and affect. His behavior is normal.       Assessment:     Recovering well so far only two weeks weeks status post laparoscopically assisted sigmoid colectomy for recurrent sigmoid diverticulitis     Plan:     Increase activity as tolerated to regular activity.  Low impact exercise such as walking an hour a day at least ideal.  Do not push through pain.  Okay to return to work next week.  That should  give enough recovery time.  Letter was given to him.  If he needs any more time, we can adjust that.  He has some FMLA paperwork that we will work to finish  Diet as tolerated.  Low fat high fiber diet ideal.  He is convinced that peanuts set off his prior attacks.  I tried to reassure them that the primary inflammed area is out now so he can have those things in moderation.  There is no proof of seeds or nuts triggering diverticulitis.  Bowel regimen with 30 g fiber a day and fiber supplement as needed to avoid problems.  Return to clinic in 3 weeks, sooner as needed.   Instructions discussed.  Followup with primary care physician for other health issues as would normally be done.  Questions answered.  The patient expressed understanding and appreciation

## 2013-02-18 NOTE — Patient Instructions (Signed)
ABDOMINAL SURGERY: POST OP INSTRUCTIONS  1. DIET: Follow a light bland diet the first 24 hours after arrival home, such as soup, liquids, crackers, etc.  Be sure to include lots of fluids daily.  Avoid fast food or heavy meals as your are more likely to get nauseated.  Eat a low fat the next few days after surgery.   2. Take your usually prescribed home medications unless otherwise directed. 3. PAIN CONTROL: a. Pain is best controlled by a usual combination of three different methods TOGETHER: i. Ice/Heat ii. Over the counter pain medication iii. Prescription pain medication b. Most patients will experience some swelling and bruising around the incisions.  Ice packs or heating pads (30-60 minutes up to 6 times a day) will help. Use ice for the first few days to help decrease swelling and bruising, then switch to heat to help relax tight/sore spots and speed recovery.  Some people prefer to use ice alone, heat alone, alternating between ice & heat.  Experiment to what works for you.  Swelling and bruising can take several weeks to resolve.   c. It is helpful to take an over-the-counter pain medication regularly for the first few weeks.  Choose one of the following that works best for you: i. Naproxen (Aleve, etc)  Two 262m tabs twice a day ii. Ibuprofen (Advil, etc) Three 2066mtabs four times a day (every meal & bedtime) iii. Acetaminophen (Tylenol, etc) 500-65011mour times a day (every meal & bedtime) d. A  prescription for pain medication (such as oxycodone, hydrocodone, etc) should be given to you upon discharge.  Take your pain medication as prescribed.  i. If you are having problems/concerns with the prescription medicine (does not control pain, nausea, vomiting, rash, itching, etc), please call us [phone] see if we need to switch you to a different pain medicine that will work better for you and/or control your side effect better. ii. If you need a refill on your pain medication,  please contact your pharmacy.  They will contact our office to request authorization. Prescriptions will not be filled after 5 pm or on week-ends. 4. Avoid getting constipated.  Between the surgery and the pain medications, it is common to experience some constipation.  Increasing fluid intake and taking a fiber supplement (such as Metamucil, Citrucel, FiberCon, MiraLax, etc) 1-2 times a day regularly will usually help prevent this problem from occurring.  A mild laxative (prune juice, Milk of Magnesia, MiraLax, etc) should be taken according to package directions if there are no bowel movements after 48 hours.   5. Watch out for diarrhea.  If you have many loose bowel movements, simplify your diet to bland foods & liquids for a few days.  Stop any stool softeners and decrease your fiber supplement.  Switching to mild anti-diarrheal medications (Kayopectate, Pepto Bismol) can help.  If this worsens or does not improve, please call us.[place]. Wash / shower every day.  You may shower over the incision / wound.  Avoid baths until the skin is fully healed.  Continue to shower over incision(s) after the dressing is off. 7. Remove your waterproof bandages 5 days after surgery.  You may leave the incision open to air.  You may replace a dressing/Band-Aid to cover the incision for comfort if you wish. 8. ACTIVITIES as tolerated:   a. You may resume regular (light) daily activities beginning the next day-such as daily self-care, walking, climbing stairs-gradually increasing activities as tolerated.  If you can  walk 30 minutes without difficulty, it is safe to try more intense activity such as jogging, treadmill, bicycling, low-impact aerobics, swimming, etc. b. Save the most intensive and strenuous activity for last such as sit-ups, heavy lifting, contact sports, etc  Refrain from any heavy lifting or straining until you are off narcotics for pain control.   c. DO NOT PUSH THROUGH PAIN.  Let pain be your guide: If it  hurts to do something, don't do it.  Pain is your body warning you to avoid that activity for another week until the pain goes down. d. You may drive when you are no longer taking prescription pain medication, you can comfortably wear a seatbelt, and you can safely maneuver your car and apply brakes. e. Dennis Bast may have sexual intercourse when it is comfortable.  9. FOLLOW UP in our office a. Please call [facility] at [phone] to set up an appointment to see your surgeon in the office for a follow-up appointment approximately 1-2 weeks after your surgery. b. Make sure that you call for this appointment the day you arrive home to insure a convenient appointment time. 10. IF YOU HAVE DISABILITY OR FAMILY LEAVE FORMS, BRING THEM TO THE OFFICE FOR PROCESSING.  DO NOT GIVE THEM TO YOUR DOCTOR.   WHEN TO CALL us [phone]: 1. Poor pain control 2. Reactions / problems with new medications (rash/itching, nausea, etc)  3. Fever over 101.5 F (38.5 C) 4. Inability to urinate 5. Nausea and/or vomiting 6. Worsening swelling or bruising 7. Continued bleeding from incision. 8. Increased pain, redness, or drainage from the incision  The clinic staff is available to answer your questions during regular business hours (8:30am-5pm).  Please don't hesitate to call and ask to speak to one of our nurses for clinical concerns.   A surgeon from [facility] is always on call at the hospitals   If you have a medical emergency, go to the nearest emergency room or call 911.    [facility], [address], [place], [place], [place]  [address] ? MAIN: [phone] ? TOLL FREE: [phone] ? FAX [phone] [email or web address]  GETTING TO GOOD BOWEL HEALTH. Irregular bowel habits such as constipation and diarrhea can lead to many problems over time.  Having one soft bowel movement a day is the most important way to prevent further problems.  The anorectal canal  is designed to handle stretching and feces to safely manage our ability to get rid of solid waste (feces, poop, stool) out of our body.  BUT, hard constipated stools can act like ripping concrete bricks and diarrhea can be a burning fire to this very sensitive area of our body, causing inflamed hemorrhoids, anal fissures, increasing risk is perirectal abscesses, abdominal pain/bloating, an making irritable bowel worse.     The goal: ONE SOFT BOWEL MOVEMENT A DAY!  To have soft, regular bowel movements:    Drink at least 8 tall glasses of water a day.     Take plenty of fiber.  Fiber is the undigested part of plant food that passes into the colon, acting s "natures broom" to encourage bowel motility and movement.  Fiber can absorb and hold large amounts of water. This results in a larger, bulkier stool, which is soft and easier to pass. Work gradually over several weeks up to 6 servings a day of fiber (25g a day even more if needed) in the form of: o Vegetables -- Root (potatoes, carrots, turnips),  leafy green (lettuce, salad greens, celery, spinach), or cooked high residue (cabbage, broccoli, etc) o Fruit -- Fresh (unpeeled skin & pulp), Dried (prunes, apricots, cherries, etc ),  or stewed ( applesauce)  o Whole grain breads, pasta, etc (whole wheat)  o Bran cereals    Bulking Agents -- This type of water-retaining fiber generally is easily obtained each day by one of the following:  o Psyllium bran -- The psyllium plant is remarkable because its ground seeds can retain so much water. This product is available as Metamucil, Konsyl, Effersyllium, Per Diem Fiber, or the less expensive generic preparation in drug and health food stores. Although labeled a laxative, it really is not a laxative.  o Methylcellulose -- This is another fiber derived from wood which also retains water. It is available as Citrucel. o Polyethylene Glycol - and "artificial" fiber commonly called Miralax or Glycolax.  It is helpful  for people with gassy or bloated feelings with regular fiber o Flax Seed - a less gassy fiber than psyllium   No reading or other relaxing activity while on the toilet. If bowel movements take longer than 5 minutes, you are too constipated   AVOID CONSTIPATION.  High fiber and water intake usually takes care of this.  Sometimes a laxative is needed to stimulate more frequent bowel movements, but    Laxatives are not a good long-term solution as it can wear the colon out. o Osmotics (Milk of Magnesia, Fleets phosphosoda, Magnesium citrate, MiraLax, GoLytely) are safer than  o Stimulants (Senokot, Castor Oil, Dulcolax, Ex Lax)    o Do not take laxatives for more than 7days in a row.    IF SEVERELY CONSTIPATED, try a Bowel Retraining Program: o Do not use laxatives.  o Eat a diet high in roughage, such as bran cereals and leafy vegetables.  o Drink six (6) ounces of prune or apricot juice each morning.  o Eat two (2) large servings of stewed fruit each day.  o Take one (1) heaping tablespoon of a psyllium-based bulking agent twice a day. Use sugar-free sweetener when possible to avoid excessive calories.  o Eat a normal breakfast.  o Set aside 15 minutes after breakfast to sit on the toilet, but do not strain to have a bowel movement.  o If you do not have a bowel movement by the third day, use an enema and repeat the above steps.    Controlling diarrhea o Switch to liquids and simpler foods for a few days to avoid stressing your intestines further. o Avoid dairy products (especially milk & ice cream) for a short time.  The intestines often can lose the ability to digest lactose when stressed. o Avoid foods that cause gassiness or bloating.  Typical foods include beans and other legumes, cabbage, broccoli, and dairy foods.  Every person has some sensitivity to other foods, so listen to our body and avoid those foods that trigger problems for you. o Adding fiber (Citrucel, Metamucil, psyllium,  Miralax) gradually can help thicken stools by absorbing excess fluid and retrain the intestines to act more normally.  Slowly increase the dose over a few weeks.  Too much fiber too soon can backfire and cause cramping & bloating. o Probiotics (such as active yogurt, Align, etc) may help repopulate the intestines and colon with normal bacteria and calm down a sensitive digestive tract.  Most studies show it to be of mild help, though, and such products can be costly. o Medicines:   Bismuth  subsalicylate (ex. Kayopectate, Pepto Bismol) every 30 minutes for up to 6 doses can help control diarrhea.  Avoid if pregnant.   Loperamide (Immodium) can slow down diarrhea.  Start with two tablets (4mg  total) first and then try one tablet every 6 hours.  Avoid if you are having fevers or severe pain.  If you are not better or start feeling worse, stop all medicines and call your doctor for advice o Call your doctor if you are getting worse or not better.  Sometimes further testing (cultures, endoscopy, X-ray studies, bloodwork, etc) may be needed to help diagnose and treat the cause of the diarrhea.  Diverticulosis Diverticulosis is a common condition that develops when small pouches (diverticula) form in the wall of the colon. The risk of diverticulosis increases with age. It happens more often in people who eat a low-fiber diet. Most individuals with diverticulosis have no symptoms. Those individuals with symptoms usually experience abdominal pain, constipation, or loose stools (diarrhea). HOME CARE INSTRUCTIONS   Increase the amount of fiber in your diet as directed by your caregiver or dietician. This may reduce symptoms of diverticulosis.  Your caregiver may recommend taking a dietary fiber supplement.  Drink at least 6 to 8 glasses of water each day to prevent constipation.  Try not to strain when you have a bowel movement.  Your caregiver may recommend avoiding nuts and seeds to prevent complications,  although this is still an uncertain benefit.  Only take over-the-counter or prescription medicines for pain, discomfort, or fever as directed by your caregiver. FOODS WITH HIGH FIBER CONTENT INCLUDE:  Fruits. Apple, peach, pear, tangerine, raisins, prunes.  Vegetables. Brussels sprouts, asparagus, broccoli, cabbage, carrot, cauliflower, romaine lettuce, spinach, summer squash, tomato, winter squash, zucchini.  Starchy Vegetables. Baked beans, kidney beans, lima beans, split peas, lentils, potatoes (with skin).  Grains. Whole wheat bread, brown rice, bran flake cereal, plain oatmeal, white rice, shredded wheat, bran muffins. SEEK IMMEDIATE MEDICAL CARE IF:   You develop increasing pain or severe bloating.  You have an oral temperature above 102 F (38.9 C), not controlled by medicine.  You develop vomiting or bowel movements that are bloody or black. Document Released: 01/14/2004 Document Revised: 07/11/2011 Document Reviewed: 09/16/2009 [facility] Patient Information 2014 [place], [place].  High-Fiber Diet Fiber is found in fruits, vegetables, and grains. A high-fiber diet encourages the addition of more whole grains, legumes, fruits, and vegetables in your diet. The recommended amount of fiber for adult males is 38 g per day. For adult females, it is 25 g per day. Pregnant and lactating women should get 28 g of fiber per day. If you have a digestive or bowel problem, ask your caregiver for advice before adding high-fiber foods to your diet. Eat a variety of high-fiber foods instead of only a select few type of foods.  PURPOSE  To increase stool bulk.  To make bowel movements more regular to prevent constipation.  To lower cholesterol.  To prevent overeating. WHEN IS THIS DIET USED?  It may be used if you have constipation and hemorrhoids.  It may be used if you have uncomplicated diverticulosis (intestine condition) and irritable bowel syndrome.  It may be used if you need help  with weight management.  It may be used if you want to add it to your diet as a protective measure against atherosclerosis, diabetes, and cancer. SOURCES OF FIBER  Whole-grain breads and cereals.  Fruits, such as apples, oranges, bananas, berries, prunes, and pears.  Vegetables, such as  green peas, carrots, sweet potatoes, beets, broccoli, cabbage, spinach, and artichokes.  Legumes, such split peas, soy, lentils.  Almonds. FIBER CONTENT IN FOODS Starches and Grains / Dietary Fiber (g)  Cheerios, 1 cup / 3 g  Corn Flakes cereal, 1 cup / 0.7 g  Rice crispy treat cereal, 1 cup / 0.3 g  Instant oatmeal (cooked),  cup / 2 g  Frosted wheat cereal, 1 cup / 5.1 g  Brown, long-grain rice (cooked), 1 cup / 3.5 g  White, long-grain rice (cooked), 1 cup / 0.6 g  Enriched macaroni (cooked), 1 cup / 2.5 g Legumes / Dietary Fiber (g)  Baked beans (canned, plain, or vegetarian),  cup / 5.2 g  Kidney beans (canned),  cup / 6.8 g  Pinto beans (cooked),  cup / 5.5 g Breads and Crackers / Dietary Fiber (g)  Plain or honey graham crackers, 2 squares / 0.7 g  Saltine crackers, 3 squares / 0.3 g  Plain, salted pretzels, 10 pieces / 1.8 g  Whole-wheat bread, 1 slice / 1.9 g  White bread, 1 slice / 0.7 g  Raisin bread, 1 slice / 1.2 g  Plain bagel, 3 oz / 2 g  Flour tortilla, 1 oz / 0.9 g  Corn tortilla, 1 small / 1.5 g  Hamburger or hotdog bun, 1 small / 0.9 g Fruits / Dietary Fiber (g)  Apple with skin, 1 medium / 4.4 g  Sweetened applesauce,  cup / 1.5 g  Banana,  medium / 1.5 g  Grapes, 10 grapes / 0.4 g  Orange, 1 small / 2.3 g  Raisin, 1.5 oz / 1.6 g  Melon, 1 cup / 1.4 g Vegetables / Dietary Fiber (g)  Green beans (canned),  cup / 1.3 g  Carrots (cooked),  cup / 2.3 g  Broccoli (cooked),  cup / 2.8 g  Peas (cooked),  cup / 4.4 g  Mashed potatoes,  cup / 1.6 g  Lettuce, 1 cup / 0.5 g  Corn (canned),  cup / 1.6 g  Tomato,  cup / 1.1  g Document Released: 04/18/2005 Document Revised: 10/18/2011 Document Reviewed: 07/21/2011 [facility] Patient Information 2014 [place], [place].

## 2013-03-07 ENCOUNTER — Other Ambulatory Visit: Payer: Self-pay

## 2013-03-14 ENCOUNTER — Encounter (INDEPENDENT_AMBULATORY_CARE_PROVIDER_SITE_OTHER): Payer: Self-pay | Admitting: Surgery

## 2013-03-14 ENCOUNTER — Ambulatory Visit (INDEPENDENT_AMBULATORY_CARE_PROVIDER_SITE_OTHER): Payer: 59 | Admitting: Surgery

## 2013-03-14 VITALS — BP 132/78 | HR 68 | Temp 97.3°F | Resp 16 | Ht 72.0 in | Wt 193.4 lb

## 2013-03-14 DIAGNOSIS — K5732 Diverticulitis of large intestine without perforation or abscess without bleeding: Secondary | ICD-10-CM

## 2013-03-14 DIAGNOSIS — K5792 Diverticulitis of intestine, part unspecified, without perforation or abscess without bleeding: Secondary | ICD-10-CM

## 2013-03-14 NOTE — Progress Notes (Signed)
Subjective:     Patient ID: Keith Schmitt, male   DOB: June 09, 1960, 52 y.o.   MRN: [id number]  HPI   Keith Schmitt  04/12/1961 [id number]  Patient Care Team: [name], MD as PCP - General [name], MD as Consulting Physician (General Surgery) [name], MD as Consulting Physician (Gastroenterology)  Procedure (Date: 01/31/2013):  POST-OPERATIVE DIAGNOSIS: recurrent diverticulitis of descending/sigmoid colon   PROCEDURE: Procedure(s):  LAPAROSCOPIC PARTIAL SIGMOID COLECTOMY  RIGID PROCTOSCOPY   SURGEON: Surgeon(s):  [name], MD  Kandis Cocking, MD - Assist   Diagnosis Colon, segmental resection, sigmoid - ACUTE DIVERTICULITIS AND DIVERTICULOSIS. - NO ATYPIA OR MALIGNANCY. - RESECTION MARGINS ARE VIABLE. [name] MD Pathologist, Electronic Signature (Case signed 02/01/2013)  This patient returns for surgical re-evaluation.  He does feel a little swelling above his Pfannenstiel incision.  It concerns him.  He has gone back to work.  He has been exercising on a gym and riding a bike.  Most frustrating thing is persistent soreness on his left side of his incision.  Worse after exercising.  Taking naproxen 500 twice a day.  Using an ice pack.  He is back to work up her.  No fevers or chills.  Bowels mostly regular.  Has been sticking to a bland diet.  His thinking about getting back to his usual high fiber diet.  No bleeding.  Energy level pretty close to normal but the pain bothers him.  No nausea or vomiting.    Patient Active Problem List   Diagnosis Date Noted  . Diverticulitis - recurrent - s/p lap left colectomy 01/31/2013 12/24/2012  . Chest pain 01/25/2012  . Heel pain 05/04/2011  . Rotator cuff tendonitis 12/14/2010  . Stress fracture of left tibia with delayed healing 11/02/2010  . Elbow pain, left 07/30/2010  . 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 ABNORMALITY OF GAIT 07/07/2010  . Basal cell cancer 04/24/2007  .  HYPERLIPIDEMIA NEC/NOS 11/14/2006    Past Medical History  Diagnosis Date  . History of Osgood-Schlatter disease   . Hyperlipidemia     high "bad cholesterol"  . NEVUS, ATYPICAL   . UNEQUAL LEG LENGTH   . Abnormality of gait   . Elbow pain, left     left elbow pain intermittent "0-5"'  . Stress fracture of left tibia with delayed healing     shin area left knee-is improved  . Heel pain 01-23-13    left heel pain level "0-3"  . Diverticulosis of colon (without mention of hemorrhage)   . Colon polyp     Tubular Adenoma   . Diverticulitis   . Rotator cuff tendonitis     no problem now in2 months- left shoulder    Past Surgical History  Procedure Laterality Date  . Surgery r knee post trauma  1988    "bone fragment"  . Appendectomy  1999    Dr Gerrit Friends  . Vasectomy  1998  . Refractive surgery  2003     Dr Delaney Meigs  . Laparoscopic sigmoid colectomy  01/31/2013    Dr Michaell Cowing  . Laparoscopic partial colectomy N/A 01/31/2013    Procedure: LAPAROSCOPIC PARTIAL SIGMOIDCOLECTOMY;  Surgeon: [name], MD;  Location: [facility];  Service: General;  Laterality: N/A;  . Proctoscopy N/A 01/31/2013    Procedure: RIGID PROCTOSCOPY;  Surgeon: [name], MD;  Location: [facility];  Service: General;  Laterality: N/A;    History   Social History  .  Marital Status: Married    Spouse Name: N/A    Number of Children: N/A  . Years of Education: N/A   Occupational History  . Not on file.   Social History Main Topics  . Smoking status: Never Smoker   . Smokeless tobacco: Never Used  . Alcohol Use: 3.0 oz/week    5 Cans of beer per week     Comment: weekly  . Drug Use: No  . Sexual Activity: Yes   Other Topics Concern  . Not on file   Social History Narrative  . No narrative on file    Family History  Problem Relation Age of Onset  . Heart attack Father 76  . Alcohol abuse Father   . Heart attack Mother 55  . Alcohol abuse Mother   . Heart attack Paternal Uncle 98  .  Cancer Neg Hx   . Diabetes Neg Hx   . Stroke Neg Hx     Current Outpatient Prescriptions  Medication Sig Dispense Refill  . naproxen (NAPROSYN) 500 MG tablet Take 1 tablet (500 mg total) by mouth 2 (two) times daily with a meal.  40 tablet  1   No current facility-administered medications for this visit.     Allergies  Allergen Reactions  . Vytorin [Ezetimibe-Simvastatin]     Muscle pain    BP 132/78  Pulse 68  Temp(Src) 97.3 F (36.3 C) (Temporal)  Resp 16  Ht 6' (1.829 m)  Wt 193 lb 6.4 oz (87.726 kg)  BMI 26.22 kg/m2  No results found.   Review of Systems  Constitutional: Negative for fever, chills and diaphoresis.  HENT: Negative for sore throat and trouble swallowing.   Eyes: Negative for photophobia and visual disturbance.  Respiratory: Negative for choking and shortness of breath.   Cardiovascular: Negative for chest pain and palpitations.       Walking several times a day.  Averaging 6 miles a day total.  Gastrointestinal: Negative for nausea, vomiting, abdominal distention, anal bleeding and rectal pain.  Genitourinary: Negative for dysuria, urgency, difficulty urinating and testicular pain.  Musculoskeletal: Negative for arthralgias, gait problem, myalgias and neck pain.  Skin: Negative for color change and rash.  Neurological: Negative for dizziness, speech difficulty, weakness and numbness.  Hematological: Negative for adenopathy.  Psychiatric/Behavioral: Negative for hallucinations, confusion and agitation.       Objective:   Physical Exam  Constitutional: He is oriented to person, place, and time. He appears well-developed and well-nourished. No distress.  HENT:  Head: Normocephalic.  Mouth/Throat: Oropharynx is clear and moist. No oropharyngeal exudate.  Eyes: Conjunctivae and EOM are normal. Pupils are equal, round, and reactive to light. No scleral icterus.  Neck: Normal range of motion. No tracheal deviation present.  Cardiovascular: Normal  rate, normal heart sounds and intact distal pulses.   Pulmonary/Chest: Effort normal. No respiratory distress.  Abdominal: Soft. He exhibits no distension. There is no tenderness. Hernia confirmed negative in the right inguinal area and confirmed negative in the left inguinal area.    Incisions clean with normal healing ridges.  No hernias  Musculoskeletal: Normal range of motion. He exhibits no tenderness.  Neurological: He is alert and oriented to person, place, and time. No cranial nerve deficit. He exhibits normal muscle tone. Coordination normal.  Skin: Skin is warm and dry. No rash noted. He is not diaphoretic.  Psychiatric: He has a normal mood and affect. His behavior is normal.       Assessment:  Recovering well s/p laparoscopically assisted sigmoid colectomy for recurrent sigmoid diverticulitis   Persistent annoying musculoskeletal strain on the left lateral aspect of Pfannenstiel incision.    Plan:     Decreased activity to low-impact only.  Stay away from the gym.  Stop exercising.  Switch to heat 6 times a day.  Switch from naproxen to ibuprofen.  Call us next week to let us know how he is doing.  If not improved, and on meloxicam.  Would reserve local field block injections his last option.  It seems to be more annoying anything else.  Hopefully he can be compliant with decreased activity for 1-2 weeksto allow this drain to heal.  Do not push through pain.  Okay to work as tolerated   Diet as tolerated.  Low fat high fiber diet ideal.   Get off bland diet and get back to a more solid diet.   Bowel regimen with 30 g fiber a day and fiber supplement as needed to avoid problems.  Return to clinic as needed.   Instructions discussed.  Followup with primary care physician for other health issues as would normally be done.  Questions answered.  The patient expressed understanding and appreciation

## 2013-03-14 NOTE — Patient Instructions (Signed)
Managing Pain  Pain after surgery or related to activity is often due to strain/injury to muscle, tendon, nerves and/or incisions.  This pain is usually short-term and will improve in a few months.   Many people find it helpful to do the following things TOGETHER to help speed the process of healing and to get back to regular activity more quickly:  1. Avoid heavy physical activity a. No lifting greater than 10 pounds b. Do not "push through" the pain.  Listen to your body and avoid positions and maneuvers than reproduce the pain c. Walking is okay as tolerated, but go slowly and stop when getting sore. Do not swim, using a treadmill, ride a bike, play sports until your pain is gone.  Then, slowly return to usual activities d. Remember: If it hurts to do it, then don't do it! 2. Take Anti-inflammatory medication  a. Take with food/snack around the clock for 1-2 weeks i. This helps the muscle and nerve tissues become less irritable and calm down faster b. Stop the Naproxen & switch to ONE of the following over-the-counter medications: i. Ibuprofen 200mg  tabs (ex. Advil, Motrin) 3-4 pills with every meal and just before bedtime ii. Acetaminophen 500mg  tabs (Tylenol) 1-2 pills with every meal and just before bedtime 3. Use a Heating pad a. 4-6 times a day b. May use warm bath/hottub  or showers 4. Try Gentle Massage and/or Stretching  a. at the area of pain many times a day b. stop if you feel pain - do not overdo it  Try these steps together to help you body heal faster and avoid making things get worse.  Doing just one of these things may not be enough.    If you are not getting better after two weeks or are noticing you are getting worse, contact our office for further advice; we may need to re-evaluate you & see what other things we can do to help.  Muscle Strain Muscle strain occurs when a muscle is stretched beyond its normal length. A small number of muscle fibers generally are torn.  This is especially common in athletes. This happens when a sudden, violent force placed on a muscle stretches it too far. Usually, recovery from muscle strain takes 1 to 2 weeks. Complete healing will take 5 to 6 weeks.  HOME CARE INSTRUCTIONS   While awake, apply ice to the sore muscle for the first 2 days after the injury.  Put ice in a plastic bag.  Place a towel between your skin and the bag.  Leave the ice on for 15-20 minutes each hour.  Do not use the strained muscle for several days, until you no longer have pain.  You may wrap the injured area with an elastic bandage for comfort. Be careful not to wrap it too tightly. This may interfere with blood circulation or increase swelling.  Only take over-the-counter or prescription medicines for pain, discomfort, or fever as directed by your caregiver. SEEK MEDICAL CARE IF:  You have increasing pain or swelling in the injured area. MAKE SURE YOU:   Understand these instructions.  Will watch your condition.  Will get help right away if you are not doing well or get worse. Document Released: 04/18/2005 Document Revised: 07/11/2011 Document Reviewed: 04/30/2011 [facility] Patient Information 2014 [place], [place].  Diverticulosis Diverticulosis is a common condition that develops when small pouches (diverticula) form in the wall of the colon. The risk of diverticulosis increases with age. It happens more often in  people who eat a low-fiber diet. Most individuals with diverticulosis have no symptoms. Those individuals with symptoms usually experience abdominal pain, constipation, or loose stools (diarrhea). HOME CARE INSTRUCTIONS   Increase the amount of fiber in your diet as directed by your caregiver or dietician. This may reduce symptoms of diverticulosis.  Your caregiver may recommend taking a dietary fiber supplement.  Drink at least 6 to 8 glasses of water each day to prevent constipation.  Try not to strain when you have a  bowel movement.  Your caregiver may recommend avoiding nuts and seeds to prevent complications, although this is still an uncertain benefit.  Only take over-the-counter or prescription medicines for pain, discomfort, or fever as directed by your caregiver. FOODS WITH HIGH FIBER CONTENT INCLUDE:  Fruits. Apple, peach, pear, tangerine, raisins, prunes.  Vegetables. Brussels sprouts, asparagus, broccoli, cabbage, carrot, cauliflower, romaine lettuce, spinach, summer squash, tomato, winter squash, zucchini.  Starchy Vegetables. Baked beans, kidney beans, lima beans, split peas, lentils, potatoes (with skin).  Grains. Whole wheat bread, brown rice, bran flake cereal, plain oatmeal, white rice, shredded wheat, bran muffins. SEEK IMMEDIATE MEDICAL CARE IF:   You develop increasing pain or severe bloating.  You have an oral temperature above 102 F (38.9 C), not controlled by medicine.  You develop vomiting or bowel movements that are bloody or black. Document Released: 01/14/2004 Document Revised: 07/11/2011 Document Reviewed: 09/16/2009 [facility] Patient Information 2014 [place], [place].

## 2013-10-19 IMAGING — CT CT ABD-PELV W/ CM
2 of 5 series · 17 of 46 positions shown, 19 images · IV contrast (omnipaque)
Comparison: None.

CLINICAL DATA: Diverticulitis.  Left lower quadrant pain.  Fever.

CT ABDOMEN AND PELVIS WITH CONTRAST
TECHNIQUE: Multidetector CT imaging of the abdomen and pelvis was
performed following the standard protocol during bolus
administration of intravenous contrast.
Contrast: 100mL OMNIPAQUE IOHEXOL 300 MG/ML  SOLN

[Series 2: abd/ pel 5mm · axial · 0.76mm/px · z∈[-512,-52]mm · 14 of 104 slices shown, 16 images]
[im 6/104  soft-tissue]
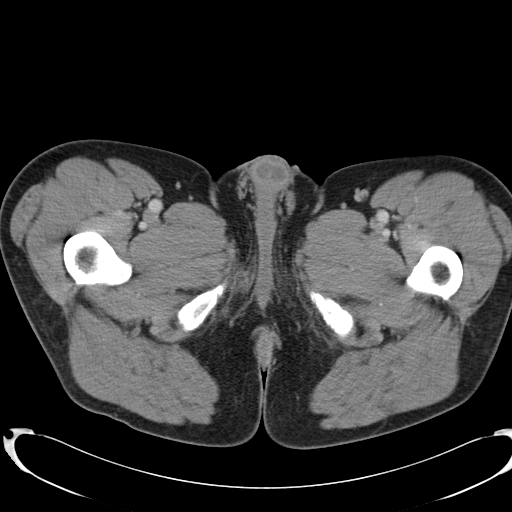
[im 6/104  bone]
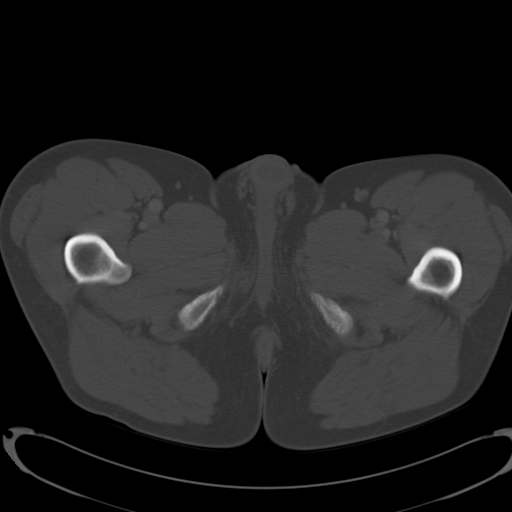
[im 16/104  soft-tissue]
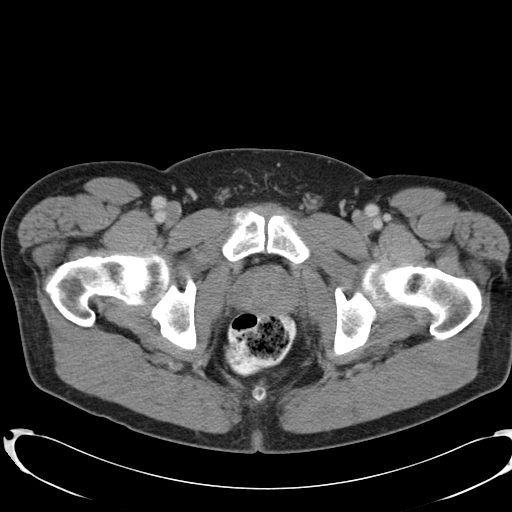
[im 21/104  soft-tissue]
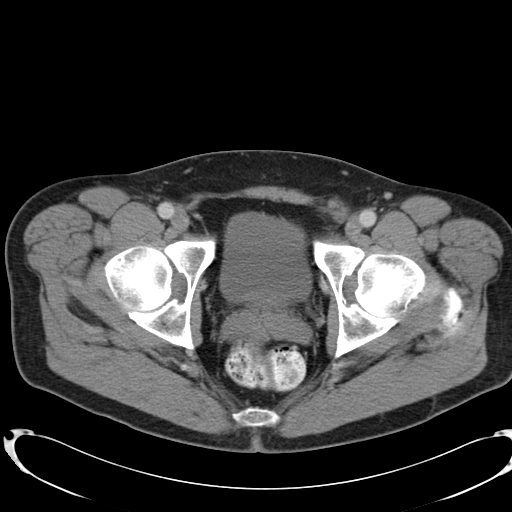
[im 26/104  soft-tissue]
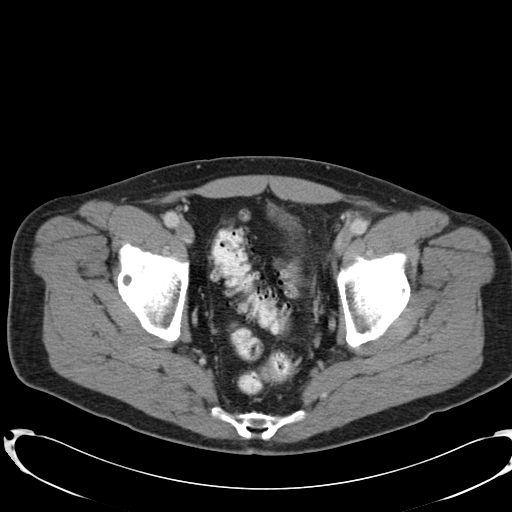
[im 37/104  soft-tissue]
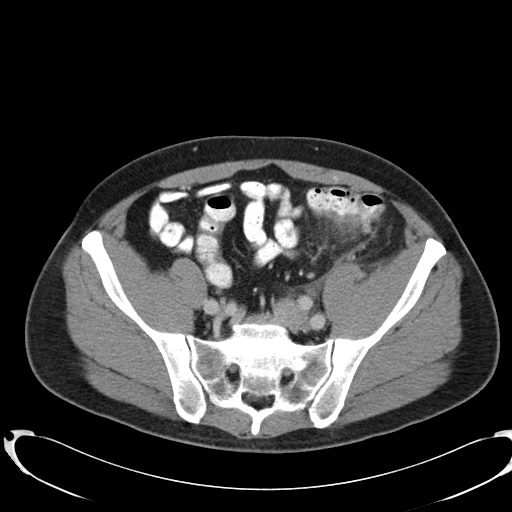
[im 42/104  soft-tissue]
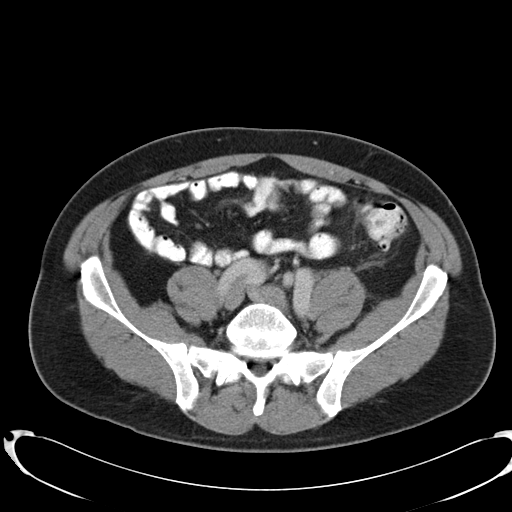
[im 47/104  soft-tissue]
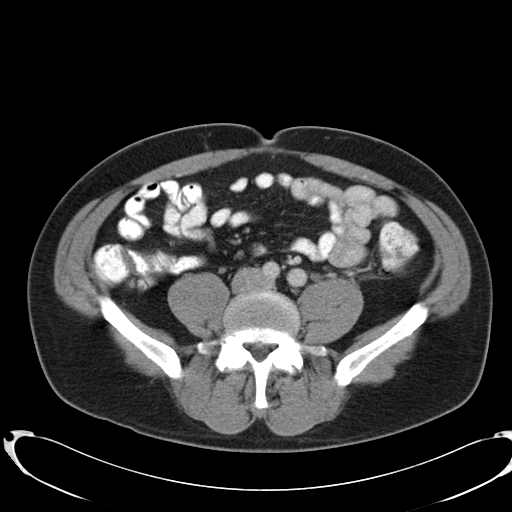
[im 57/104  soft-tissue]
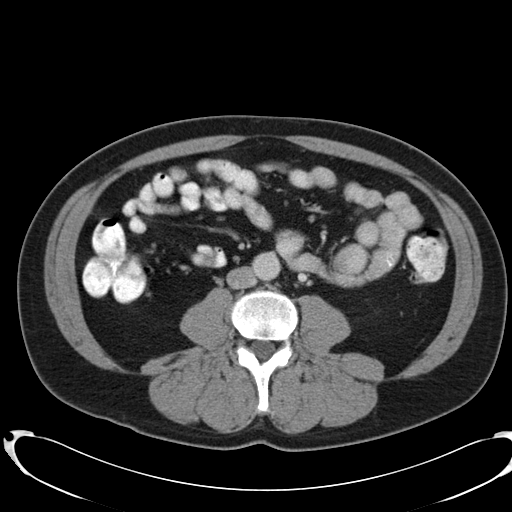
[im 62/104  soft-tissue]
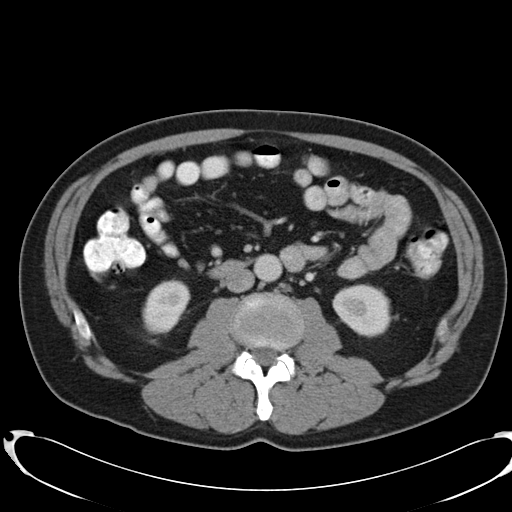
[im 62/104  bone]
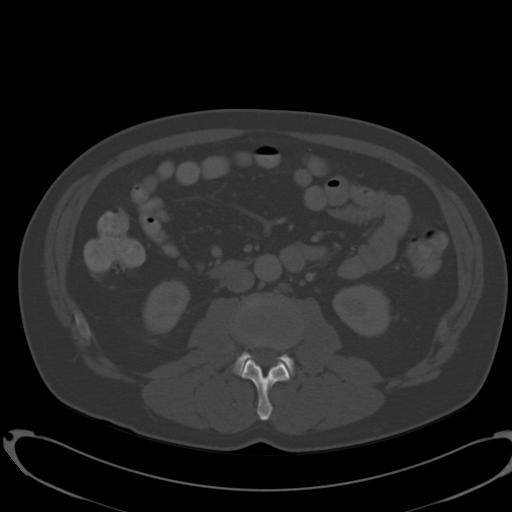
[im 67/104  soft-tissue]
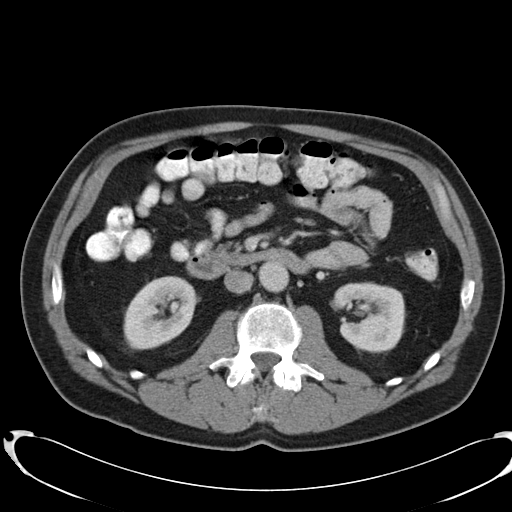
[im 78/104  soft-tissue]
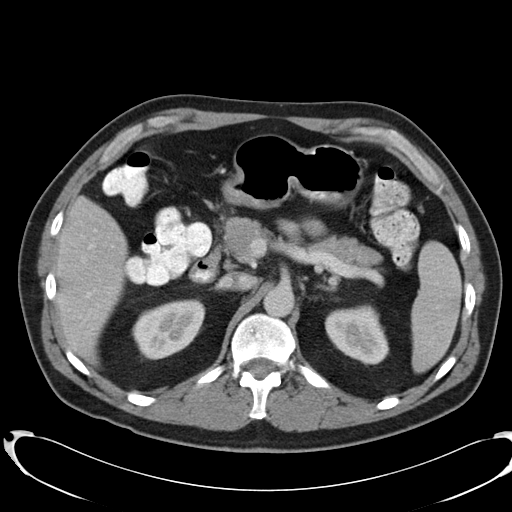
[im 83/104  soft-tissue]
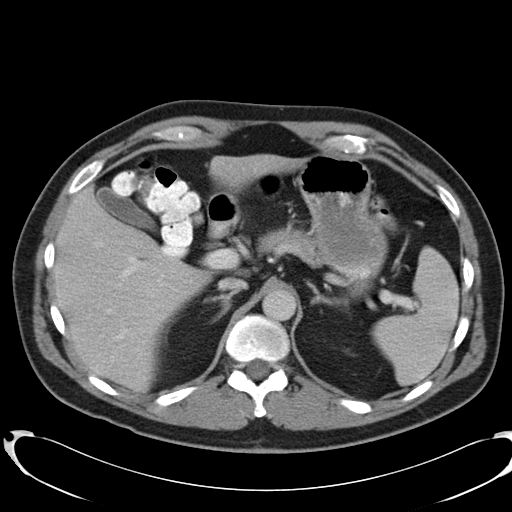
[im 88/104  soft-tissue]
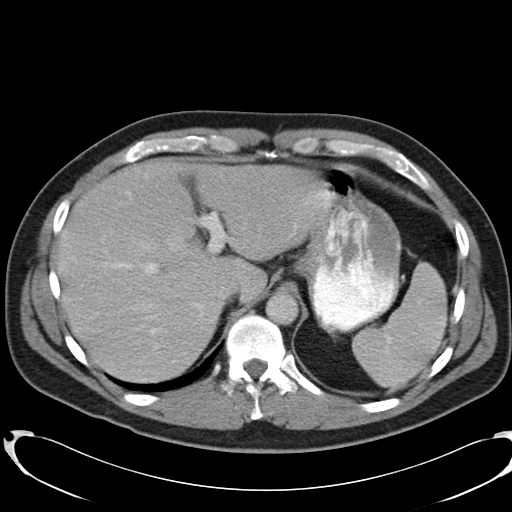
[im 98/104  soft-tissue]
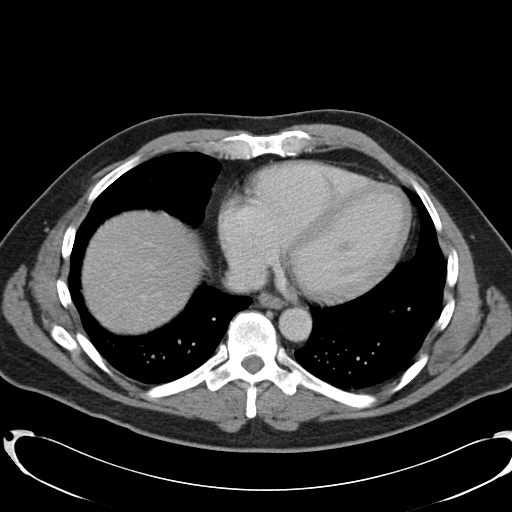

[Series 602: cor · coronal · 1.04mm/px · 3 of 119 slices shown]
[im 40/119  soft-tissue]
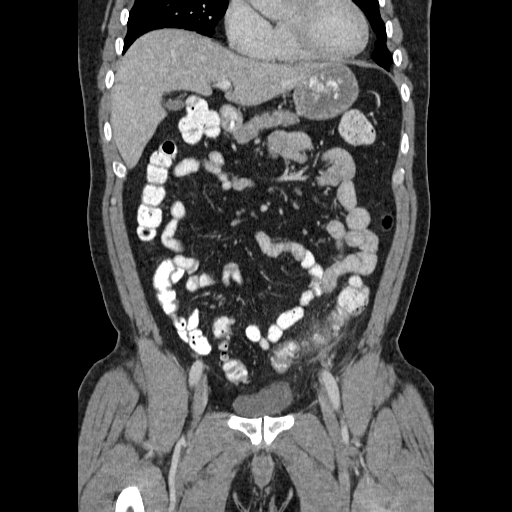
[im 53/119  soft-tissue]
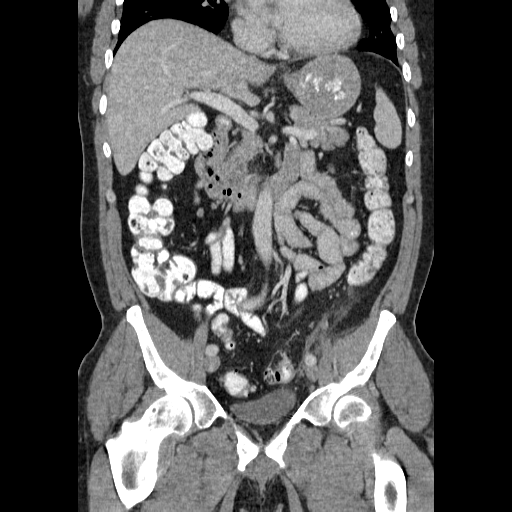
[im 66/119  soft-tissue]
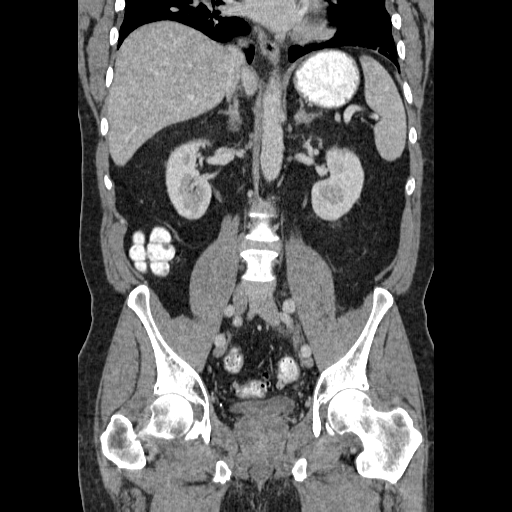

[17 of 46 positions shown; findings below may reference images not displayed]

FINDINGS: No focal abnormalities seen in the liver or spleen.  The
stomach, duodenum, pancreas, gallbladder, adrenal glands, and
kidneys are unremarkable.

No free fluid in the abdomen.  No abdominal aortic aneurysm.  No
abdominal lymphadenopathy.

Imaging through the pelvis shows no free intraperitoneal fluid.  No
pelvic sidewall lymphadenopathy.  Bladder is unremarkable.
Prostate gland is enlarged.  Diverticular disease is seen in the
sigmoid colon.  There is an area of pericolonic edema/inflammation
associated with the junction of the descending and sigmoid
segments.  No evidence for perforation or abscess.  The terminal
ileum is normal. The appendix is not visualized, but there is no
edema or inflammation in the region of the cecum.

Bone windows reveal no worrisome lytic or sclerotic osseous
lesions.
IMPRESSION: Edema/inflammation associated with the left colon near the junction
of the descending and sigmoid segment.  Imaging features are most
suggestive of diverticulitis.  No evidence for perforation or
abscess at this time.  As neoplasm can present a similar CT imaging
features, close follow-up is recommended.

## 2014-07-21 ENCOUNTER — Ambulatory Visit (INDEPENDENT_AMBULATORY_CARE_PROVIDER_SITE_OTHER): Payer: 59 | Admitting: Internal Medicine

## 2014-07-21 ENCOUNTER — Other Ambulatory Visit (INDEPENDENT_AMBULATORY_CARE_PROVIDER_SITE_OTHER): Payer: 59

## 2014-07-21 ENCOUNTER — Other Ambulatory Visit: Payer: Self-pay | Admitting: Internal Medicine

## 2014-07-21 ENCOUNTER — Encounter: Payer: Self-pay | Admitting: Internal Medicine

## 2014-07-21 VITALS — BP 140/100 | HR 68 | Temp 97.8°F | Resp 15 | Ht 72.0 in | Wt 204.8 lb

## 2014-07-21 DIAGNOSIS — E785 Hyperlipidemia, unspecified: Secondary | ICD-10-CM

## 2014-07-21 DIAGNOSIS — R030 Elevated blood-pressure reading, without diagnosis of hypertension: Secondary | ICD-10-CM

## 2014-07-21 DIAGNOSIS — Z8249 Family history of ischemic heart disease and other diseases of the circulatory system: Secondary | ICD-10-CM

## 2014-07-21 DIAGNOSIS — Z01810 Encounter for preprocedural cardiovascular examination: Secondary | ICD-10-CM

## 2014-07-21 DIAGNOSIS — M7582 Other shoulder lesions, left shoulder: Secondary | ICD-10-CM

## 2014-07-21 LAB — TSH: TSH: 1.49 u[IU]/mL (ref 0.35–4.50)

## 2014-07-21 LAB — GLUCOSE, RANDOM: GLUCOSE: 84 mg/dL (ref 70–99)

## 2014-07-21 MED ORDER — METOPROLOL TARTRATE 25 MG PO TABS: 25.0000 mg | ORAL_TABLET | Freq: Two times a day (BID) | ORAL | Status: DC

## 2014-07-21 NOTE — Progress Notes (Signed)
Pre visit review using our clinic review tool, if applicable. No additional management support is needed unless otherwise documented below in the visit note. 

## 2014-07-21 NOTE — Progress Notes (Signed)
Subjective:    Patient ID: Keith Schmitt, male    DOB: 1960-12-25, 54 y.o.   MRN: [id number]  HPI Surgery is to be scheduled for left shoulder impingement issues with partial biceps tendon tear and ligamentous tear by Dr Netta Cedars.. There is also question of rotator cuff dysfunction. This is associated with severe decreased range of motion as well as pain with such actions as gripping doorknob and pulling or lifting items.  He exercises on bike 30 min qod w/o active cardio pulmonary symptoms  In 2014 he had a partial colectomy for complications of diverticulitis. He has no active GI symptoms at this time.  He is on a modified heart healthy diet and does not add salt. He does not monitor his blood pressure at home.  Advanced cholesterol testing reveals his LDL goal is less than 100, ideally less than 70. There is a strong family history of premature coronary disease in both parents as well as a paternal uncle. He was intolerant to Vytorin due to myalgias..   Review of Systems  Chest pain, palpitations, tachycardia, exertional dyspnea, paroxysmal nocturnal dyspnea, claudication or edema are absent.  Unexplained weight loss, abdominal pain, significant dyspepsia, dysphagia, melena, rectal bleeding, or persistently small caliber stools are denied.     Objective:   Physical Exam  Gen.: Adequately nourished in appearance. Alert, appropriate and cooperative throughout exam. BMI:74.76 Appears younger than stated age  Head: Normocephalic without obvious abnormalities; goatee; pattern alopecia  Eyes: No corneal or conjunctival inflammation noted. Pupils equal round reactive to light and accommodation. Extraocular motion intact.  Ears: External  ear exam reveals no significant lesions or deformities. Canals clear .TMs normal. Hearing is grossly normal bilaterally. Nose: External nasal exam reveals no deformity or inflammation. Nasal mucosa are pink and moist. No lesions or exudates  noted.   Mouth: Oral mucosa and oropharynx reveal no lesions or exudates. Teeth in good repair. Neck: No deformities, masses, or tenderness noted. Range of motion slightly decreased. Thyroid normal. Lungs: Normal respiratory effort; chest expands symmetrically. Lungs are clear to auscultation without rales, wheezes, or increased work of breathing. Heart: Normal rate and rhythm. Normal S1 and S2. No gallop, click, or rub. S4 w/o murmur. Abdomen: Bowel sounds normal; abdomen soft and nontender. No masses, organomegaly or hernias noted. Musculoskeletal/extremities: No deformity or scoliosis noted of  the thoracic or lumbar spine.  No clubbing, cyanosis, edema, or significant extremity  deformity noted.  Range of motion decreased L shoulder laterally or medially but he can elavate LUE above head. Tone & strength normal. Hand joints normal Fingernail  health good. Able to lie down & sit up w/o help.  Negative SLR bilaterally Vascular: Carotid, radial artery, dorsalis pedis and  posterior tibial pulses are full and equal. No bruits present. Neurologic: Alert and oriented x3. Deep tendon reflexes symmetrical and normal.  Gait normal       Skin: Intact without suspicious lesions or rashes. Lymph: No cervical, axillary lymphadenopathy present. Psych: Mood and affect are normal. Normally interactive  Assessment & Plan:  #1 left shoulder impingement syndrome  #2 elevated blood pressure without diagnosis of hypertension  #3 dyslipidemia  #4 family history of premature coronary artery disease  #5 preoperative cardiovascular evaluation  Plan: See orders and recommendations. The EKG does show poor R-wave progression across precordium without associated ischemic ST-T changes. Lipid risk will be reassessed.

## 2014-07-21 NOTE — Patient Instructions (Addendum)
  Your next office appointment will be determined based upon review of your pending labs  Those instructions will be transmitted to you through My Chart  Critical values will be called. Followup as needed for any active or acute issue. Please report any significant change in your symptoms.   Minimal Blood Pressure Goal= AVERAGE < 140/90;  Ideal is an AVERAGE < 135/85. This AVERAGE should be calculated from @ least 5-7 BP readings taken @ different times of day on different days of week. You should not respond to isolated BP readings , but rather the AVERAGE for that week .Please bring your  blood pressure cuff to office visits to verify that it is reliable.It  can also be checked against the blood pressure device at the pharmacy. Finger or wrist cuffs are not dependable; an arm cuff is.  Fill the  prescription for the BP medication if BP NOT @ goal based on  7 to 14 day average.

## 2014-07-23 LAB — NMR LIPOPROFILE WITH LIPIDS
CHOLESTEROL, TOTAL: 193 mg/dL (ref 100–199)
HDL PARTICLE NUMBER: 23.9 umol/L — AB (ref >=30.5)
HDL Size: 8.2 nm — ABNORMAL LOW (ref >=9.2)
HDL-C: 38 mg/dL — ABNORMAL LOW (ref >39)
LDL CALC: 137 mg/dL — AB (ref 0–99)
LDL Particle Number: 1494 nmol/L — ABNORMAL HIGH (ref <1000)
LDL Size: 20.8 nm (ref >=20.8)
LP-IR Score: 63 — ABNORMAL HIGH (ref <=45)
Large HDL-P: 1.7 umol/L — ABNORMAL LOW (ref >=4.8)
Large VLDL-P: 3 nmol/L — ABNORMAL HIGH (ref <=2.7)
SMALL LDL PARTICLE NUMBER: 663 nmol/L — AB (ref <=527)
Triglycerides: 92 mg/dL (ref 0–149)
VLDL SIZE: 41.7 nm (ref <=46.6)

## 2015-10-07 ENCOUNTER — Encounter: Payer: Self-pay | Admitting: Family

## 2015-10-07 ENCOUNTER — Ambulatory Visit (INDEPENDENT_AMBULATORY_CARE_PROVIDER_SITE_OTHER): Payer: 59 | Admitting: Family

## 2015-10-07 VITALS — BP 158/100 | HR 76 | Temp 98.0°F | Resp 16 | Ht 72.0 in | Wt 200.0 lb

## 2015-10-07 DIAGNOSIS — S61411A Laceration without foreign body of right hand, initial encounter: Secondary | ICD-10-CM

## 2015-10-07 DIAGNOSIS — S61419A Laceration without foreign body of unspecified hand, initial encounter: Secondary | ICD-10-CM | POA: Insufficient documentation
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
## 2015-10-07 NOTE — Progress Notes (Signed)
Pre visit review using our clinic review tool, if applicable. No additional management support is needed unless otherwise documented below in the visit note. 

## 2015-10-07 NOTE — Patient Instructions (Addendum)
Thank you for choosing [facility].  Summary/Instructions:  Keep clean with soap and water.   Triple antibiotic ointment as needed.  Follow up for signs of infection.   If your symptoms worsen or fail to improve, please contact our office for further instruction, or in case of emergency go directly to the emergency room at the closest medical facility.    Suture Removal, Care After Refer to this sheet in the next few weeks. These instructions provide you with information on caring for yourself after your procedure. Your health care provider may also give you more specific instructions. Your treatment has been planned according to current medical practices, but problems sometimes occur. Call your health care provider if you have any problems or questions after your procedure. WHAT TO EXPECT AFTER THE PROCEDURE After your stitches (sutures) are removed, it is typical to have the following:  Some discomfort and swelling in the wound area.  Slight redness in the area. HOME CARE INSTRUCTIONS   If you have skin adhesive strips over the wound area, do not take the strips off. They will fall off on their own in a few days. If the strips remain in place after 14 days, you may remove them.  Change any bandages (dressings) at least once a day or as directed by your health care provider. If the bandage sticks, soak it off with warm, soapy water.  Apply cream or ointment only as directed by your health care provider. If using cream or ointment, wash the area with soap and water 2 times a day to remove all the cream or ointment. Rinse off the soap and pat the area dry with a clean towel.  Keep the wound area dry and clean. If the bandage becomes wet or dirty, or if it develops a bad smell, change it as soon as possible.  Continue to protect the wound from injury.  Use sunscreen when out in the sun. New scars become sunburned easily. SEEK MEDICAL CARE IF:  You have increasing redness,  swelling, or pain in the wound.  You see pus coming from the wound.  You have a fever.  You notice a bad smell coming from the wound or dressing.  Your wound breaks open (edges not staying together).   This information is not intended to replace advice given to you by your health care provider. Make sure you discuss any questions you have with your health care provider.   Document Released: 01/11/2001 Document Revised: 02/06/2013 Document Reviewed: 11/28/2012 Elsevier Interactive Patient Education [employer].

## 2015-10-07 NOTE — Assessment & Plan Note (Signed)
Laceration appears well healed with good approximation and no evidence of infection. Sutures removed without complication. Instructions provided a basic wound care. Follow-up for signs of infection.

## 2017-07-31 ENCOUNTER — Encounter: Payer: Self-pay | Admitting: Gastroenterology

## 2018-05-09 ENCOUNTER — Encounter: Payer: Self-pay | Admitting: Physician Assistant

## 2018-05-09 ENCOUNTER — Ambulatory Visit (INDEPENDENT_AMBULATORY_CARE_PROVIDER_SITE_OTHER): Payer: 59 | Admitting: Physician Assistant

## 2018-05-09 ENCOUNTER — Other Ambulatory Visit: Payer: Self-pay

## 2018-05-09 VITALS — BP 130/88 | HR 74 | Temp 98.2°F | Resp 14 | Ht 71.5 in | Wt 209.0 lb

## 2018-05-09 DIAGNOSIS — Z125 Encounter for screening for malignant neoplasm of prostate: Secondary | ICD-10-CM | POA: Diagnosis not present

## 2018-05-09 DIAGNOSIS — Z1211 Encounter for screening for malignant neoplasm of colon: Secondary | ICD-10-CM

## 2018-05-09 DIAGNOSIS — E785 Hyperlipidemia, unspecified: Secondary | ICD-10-CM

## 2018-05-09 DIAGNOSIS — Z0000 Encounter for general adult medical examination without abnormal findings: Secondary | ICD-10-CM | POA: Diagnosis not present

## 2018-05-09 LAB — COMPREHENSIVE METABOLIC PANEL
ALK PHOS: 85 U/L (ref 39–117)
ALT: 26 U/L (ref 0–53)
AST: 26 U/L (ref 0–37)
Albumin: 4.5 g/dL (ref 3.5–5.2)
BUN: 15 mg/dL (ref 6–23)
CO2: 28 mEq/L (ref 19–32)
Calcium: 9.9 mg/dL (ref 8.4–10.5)
Chloride: 105 mEq/L (ref 96–112)
Creatinine, Ser: 1.17 mg/dL (ref 0.40–1.50)
GFR: 67.94 mL/min (ref >60.00)
GLUCOSE: 88 mg/dL (ref 70–99)
Potassium: 4.8 mEq/L (ref 3.5–5.1)
SODIUM: 141 meq/L (ref 135–145)
Total Bilirubin: 0.7 mg/dL (ref 0.2–1.2)
Total Protein: 7.1 g/dL (ref 6.0–8.3)

## 2018-05-09 LAB — PSA: PSA: 0.46 ng/mL (ref 0.10–4.00)

## 2018-05-09 LAB — CBC WITH DIFFERENTIAL/PLATELET
BASOS ABS: 0 10*3/uL (ref 0.0–0.1)
Basophils Relative: 0.7 % (ref 0.0–3.0)
Eosinophils Absolute: 0.4 10*3/uL (ref 0.0–0.7)
Eosinophils Relative: 5.7 % — ABNORMAL HIGH (ref 0.0–5.0)
HCT: 48.5 % (ref 39.0–52.0)
Hemoglobin: 16.9 g/dL (ref 13.0–17.0)
LYMPHS ABS: 1.6 10*3/uL (ref 0.7–4.0)
Lymphocytes Relative: 24.3 % (ref 12.0–46.0)
MCHC: 34.9 g/dL (ref 30.0–36.0)
MCV: 92.4 fl (ref 78.0–100.0)
MONO ABS: 0.6 10*3/uL (ref 0.1–1.0)
Monocytes Relative: 9.2 % (ref 3.0–12.0)
NEUTROS ABS: 4 10*3/uL (ref 1.4–7.7)
Neutrophils Relative %: 60.1 % (ref 43.0–77.0)
PLATELETS: 258 10*3/uL (ref 150.0–400.0)
RBC: 5.25 Mil/uL (ref 4.22–5.81)
RDW: 12.6 % (ref 11.5–15.5)
WBC: 6.7 10*3/uL (ref 4.0–10.5)

## 2018-05-09 LAB — LIPID PANEL
Cholesterol: 198 mg/dL (ref 0–200)
HDL: 37.6 mg/dL — ABNORMAL LOW (ref >39.00)
LDL CALC: 146 mg/dL — AB (ref 0–99)
NonHDL: 160.26
Total CHOL/HDL Ratio: 5
Triglycerides: 73 mg/dL (ref 0.0–149.0)
VLDL: 14.6 mg/dL (ref 0.0–40.0)

## 2018-05-09 LAB — HEMOGLOBIN A1C: Hgb A1c MFr Bld: 5.8 % (ref 4.6–6.5)

## 2018-05-09 NOTE — Assessment & Plan Note (Signed)
Referral to GI placed

## 2018-05-09 NOTE — Assessment & Plan Note (Signed)
The natural history of prostate cancer and ongoing controversy regarding screening and potential treatment outcomes of prostate cancer has been discussed with the patient. The meaning of a false positive PSA and a false negative PSA has been discussed. He indicates understanding of the limitations of this screening test and wishes  to proceed with screening PSA testing.  

## 2018-05-09 NOTE — Assessment & Plan Note (Signed)
Depression screen negative. Health Maintenance reviewed -- Declines flu shot, HIV screen and Hep C screen. Preventive schedule discussed and handout given in AVS. Will obtain fasting labs today.

## 2018-05-09 NOTE — Assessment & Plan Note (Signed)
Repeat fasting lipids today. Continue well-balanced diet and active lifestyle.

## 2018-05-09 NOTE — Progress Notes (Signed)
Patient presents to clinic today for annual exam.  Patient is fasting for labs.  Acute Concerns: Denies acute concerns today.  Chronic Issues: Hyperlipidemia -- Previously on Vytorin but stopped due to myalgias. Follows a well-balanced, low-fat diet. Cycles 3 x week and swims 3-4 x week. Tries to keep very active.   Health Maintenance: Immunizations -- Declines flu shot. Colonoscopy -- History of adenomatous polyps and diverticulitis. S/p partial colectomy. Due for repeat colonoscopy.   Past Medical History:  Diagnosis Date  . Abnormality of gait   . Arthritis   . Colon polyp    Tubular Adenoma   . Diverticulitis    S/P partial colectomy  . Diverticulosis of colon (without mention of hemorrhage)   . Elbow pain, left    left elbow pain intermittent "0-5"'  . Heel pain 01-23-13   left heel pain level "0-3"  . History of chickenpox   . History of Osgood-Schlatter disease   . Hyperlipidemia    high "bad cholesterol"  . NEVUS, ATYPICAL   . Rotator cuff tendonitis    no problem now in2 months- left shoulder  . Stress fracture of left tibia with delayed healing    shin area left knee-is improved  . UNEQUAL LEG LENGTH     Past Surgical History:  Procedure Laterality Date  . APPENDECTOMY  1999   Dr Harlow Asa  . LAPAROSCOPIC PARTIAL COLECTOMY N/A 01/31/2013   Procedure: LAPAROSCOPIC PARTIAL SIGMOIDCOLECTOMY;  Surgeon: Adin Hector, MD;  Location: [facility];  Service: General;  Laterality: N/A;  . LAPAROSCOPIC SIGMOID COLECTOMY  01/31/2013   Dr Johney Maine  . PROCTOSCOPY N/A 01/31/2013   Procedure: RIGID PROCTOSCOPY;  Surgeon: Adin Hector, MD;  Location: [facility];  Service: General;  Laterality: N/A;  . REFRACTIVE SURGERY  2003    Dr Lucita Ferrara  . SHOULDER SURGERY    . surgery R knee post trauma  1988   "bone fragment"  . VASECTOMY  1998    Current Outpatient Medications on File Prior to Visit  Medication Sig Dispense Refill  . aspirin EC 81 MG tablet Take 81 mg by mouth  daily.     No current facility-administered medications on file prior to visit.     Allergies  Allergen Reactions  . Vytorin [Ezetimibe-Simvastatin]     Muscle pain    Family History  Problem Relation Age of Onset  . Heart attack Father 69  . Alcohol abuse Father   . Heart attack Mother 38  . Alcohol abuse Mother   . Heart attack Paternal Uncle 64  . Cancer Neg Hx   . Diabetes Neg Hx   . Stroke Neg Hx     Social History   Socioeconomic History  . Marital status: Married    Spouse name: Not on file  . Number of children: Not on file  . Years of education: Not on file  . Highest education level: Not on file  Occupational History  . Not on file  Social Needs  . Financial resource strain: Not on file  . Food insecurity:    Worry: Not on file    Inability: Not on file  . Transportation needs:    Medical: Not on file    Non-medical: Not on file  Tobacco Use  . Smoking status: Never Smoker  . Smokeless tobacco: Never Used  Substance and Sexual Activity  . Alcohol use: Yes    Alcohol/week: 5.0 standard drinks    Types: 5 Cans of beer per  week    Comment: weekly  . Drug use: No  . Sexual activity: Yes  Lifestyle  . Physical activity:    Days per week: Not on file    Minutes per session: Not on file  . Stress: Not on file  Relationships  . Social connections:    Talks on phone: Not on file    Gets together: Not on file    Attends religious service: Not on file    Active member of club or organization: Not on file    Attends meetings of clubs or organizations: Not on file    Relationship status: Not on file  . Intimate partner violence:    Fear of current or ex partner: Not on file    Emotionally abused: Not on file    Physically abused: Not on file    Forced sexual activity: Not on file  Other Topics Concern  . Not on file  Social History Narrative  . Not on file   Review of Systems  Constitutional: Negative for fever and weight loss.  HENT: Negative  for ear discharge, ear pain, hearing loss and tinnitus.   Eyes: Negative for blurred vision, double vision, photophobia and pain.  Respiratory: Negative for cough and shortness of breath.   Cardiovascular: Negative for chest pain and palpitations.  Gastrointestinal: Negative for abdominal pain, blood in stool, constipation, diarrhea, heartburn, melena, nausea and vomiting.  Genitourinary: Negative for dysuria, flank pain, frequency, hematuria and urgency.  Musculoskeletal: Negative for falls.  Neurological: Negative for dizziness, loss of consciousness and headaches.  Endo/Heme/Allergies: Negative for environmental allergies.  Psychiatric/Behavioral: Negative for depression, hallucinations, substance abuse and suicidal ideas. The patient is not nervous/anxious and does not have insomnia.    BP 130/88   Pulse 74   Temp 98.2 F (36.8 C) (Oral)   Resp 14   Ht 5' 11.5" (1.816 m)   Wt 209 lb (94.8 kg)   SpO2 98%   BMI 28.74 kg/m   Physical Exam Vitals signs reviewed.  Constitutional:      General: He is not in acute distress.    Appearance: He is well-developed. He is not diaphoretic.  HENT:     Head: Normocephalic and atraumatic.     Right Ear: Tympanic membrane, ear canal and external ear normal.     Left Ear: Tympanic membrane, ear canal and external ear normal.     Nose: Nose normal.     Mouth/Throat:     Pharynx: No posterior oropharyngeal erythema.  Eyes:     Conjunctiva/sclera: Conjunctivae normal.     Pupils: Pupils are equal, round, and reactive to light.  Neck:     Musculoskeletal: Neck supple.     Thyroid: No thyromegaly.  Cardiovascular:     Rate and Rhythm: Normal rate and regular rhythm.     Heart sounds: Normal heart sounds.  Pulmonary:     Effort: Pulmonary effort is normal. No respiratory distress.     Breath sounds: Normal breath sounds. No wheezing or rales.  Chest:     Chest wall: No tenderness.  Abdominal:     General: Bowel sounds are normal. There is  no distension.     Palpations: Abdomen is soft. There is no mass.     Tenderness: There is no abdominal tenderness. There is no guarding or rebound.  Lymphadenopathy:     Cervical: No cervical adenopathy.  Skin:    General: Skin is warm and dry.     Findings: No rash.  Neurological:     Mental Status: He is alert and oriented to person, place, and time.     Cranial Nerves: No cranial nerve deficit.    Assessment/Plan: Visit for preventive health examination Depression screen negative. Health Maintenance reviewed -- Declines flu shot, HIV screen and Hep C screen. Preventive schedule discussed and handout given in AVS. Will obtain fasting labs today.   Prostate cancer screening The natural history of prostate cancer and ongoing controversy regarding screening and potential treatment outcomes of prostate cancer has been discussed with the patient. The meaning of a false positive PSA and a false negative PSA has been discussed. He indicates understanding of the limitations of this screening test and wishes  to proceed with screening PSA testing.   Colon cancer screening Referral to GI placed.  Hyperlipidemia Repeat fasting lipids today. Continue well-balanced diet and active lifestyle.     Leeanne Rio, PA-C

## 2018-05-09 NOTE — Patient Instructions (Signed)
Please go to the lab for blood work.   Our office will call you with your results unless you have chosen to receive results via MyChart.  If your blood work is normal we will follow-up each year for physicals and as scheduled for chronic medical problems.  If anything is abnormal we will treat accordingly and get you in for a follow-up.   Preventive Care 40-64 Years, Male Preventive care refers to lifestyle choices and visits with your health care provider that can promote health and wellness. What does preventive care include?   A yearly physical exam. This is also called an annual well check.  Dental exams once or twice a year.  Routine eye exams. Ask your health care provider how often you should have your eyes checked.  Personal lifestyle choices, including: ? Daily care of your teeth and gums. ? Regular physical activity. ? Eating a healthy diet. ? Avoiding tobacco and drug use. ? Limiting alcohol use. ? Practicing safe sex. ? Taking low-dose aspirin every day starting at age 52. What happens during an annual well check? The services and screenings done by your health care provider during your annual well check will depend on your age, overall health, lifestyle risk factors, and family history of disease. Counseling Your health care provider may ask you questions about your:  Alcohol use.  Tobacco use.  Drug use.  Emotional well-being.  Home and relationship well-being.  Sexual activity.  Eating habits.  Work and work Statistician. Screening You may have the following tests or measurements:  Height, weight, and BMI.  Blood pressure.  Lipid and cholesterol levels. These may be checked every 5 years, or more frequently if you are over 43 years old.  Skin check.  Lung cancer screening. You may have this screening every year starting at age 31 if you have a 30-pack-year history of smoking and currently smoke or have quit within the past 15  years.  Colorectal cancer screening. All adults should have this screening starting at age 15 and continuing until age 30. Your health care provider may recommend screening at age 62. You will have tests every 1-10 years, depending on your results and the type of screening test. People at increased risk should start screening at an earlier age. Screening tests may include: ? Guaiac-based fecal occult blood testing. ? Fecal immunochemical test (FIT). ? Stool DNA test. ? Virtual colonoscopy. ? Sigmoidoscopy. During this test, a flexible tube with a tiny camera (sigmoidoscope) is used to examine your rectum and lower colon. The sigmoidoscope is inserted through your anus into your rectum and lower colon. ? Colonoscopy. During this test, a long, thin, flexible tube with a tiny camera (colonoscope) is used to examine your entire colon and rectum.  Prostate cancer screening. Recommendations will vary depending on your family history and other risks.  Hepatitis C blood test.  Hepatitis B blood test.  Sexually transmitted disease (STD) testing.  Diabetes screening. This is done by checking your blood sugar (glucose) after you have not eaten for a while (fasting). You may have this done every 1-3 years. Discuss your test results, treatment options, and if necessary, the need for more tests with your health care provider. Vaccines Your health care provider may recommend certain vaccines, such as:  Influenza vaccine. This is recommended every year.  Tetanus, diphtheria, and acellular pertussis (Tdap, Td) vaccine. You may need a Td booster every 10 years.  Varicella vaccine. You may need this if you have not been vaccinated.  Zoster vaccine. You may need this after age 21.  Measles, mumps, and rubella (MMR) vaccine. You may need at least one dose of MMR if you were born in 1957 or later. You may also need a second dose.  Pneumococcal 13-valent conjugate (PCV13) vaccine. You may need this if you  have certain conditions and have not been vaccinated.  Pneumococcal polysaccharide (PPSV23) vaccine. You may need one or two doses if you smoke cigarettes or if you have certain conditions.  Meningococcal vaccine. You may need this if you have certain conditions.  Hepatitis A vaccine. You may need this if you have certain conditions or if you travel or work in places where you may be exposed to hepatitis A.  Hepatitis B vaccine. You may need this if you have certain conditions or if you travel or work in places where you may be exposed to hepatitis B.  Haemophilus influenzae type b (Hib) vaccine. You may need this if you have certain risk factors. Talk to your health care provider about which screenings and vaccines you need and how often you need them. This information is not intended to replace advice given to you by your health care provider. Make sure you discuss any questions you have with your health care provider. Document Released: 05/15/2015 Document Revised: 06/08/2017 Document Reviewed: 02/17/2015 Elsevier Interactive Patient Education  [employer]. .

## 2018-07-04 ENCOUNTER — Encounter: Payer: Self-pay | Admitting: Gastroenterology

## 2018-07-13 ENCOUNTER — Ambulatory Visit: Payer: 59 | Admitting: Nurse Practitioner

## 2018-08-06 ENCOUNTER — Encounter: Payer: 59 | Admitting: Gastroenterology

## 2018-11-08 ENCOUNTER — Other Ambulatory Visit: Payer: Self-pay

## 2018-11-08 ENCOUNTER — Encounter: Payer: Self-pay | Admitting: Gastroenterology

## 2018-11-08 ENCOUNTER — Ambulatory Visit (AMBULATORY_SURGERY_CENTER): Payer: Self-pay | Admitting: *Deleted

## 2018-11-08 VITALS — Ht 72.0 in | Wt 195.0 lb

## 2018-11-08 DIAGNOSIS — Z86010 Personal history of colonic polyps: Secondary | ICD-10-CM

## 2018-11-08 MED ORDER — SUPREP BOWEL PREP KIT 17.5-3.13-1.6 GM/177ML PO SOLN: 1.0000 | Freq: Once | ORAL | 0 refills | Status: AC

## 2018-11-08 NOTE — Progress Notes (Signed)
previsit via telephone, ID per name,dod and address No egg or soy allergy known to patient  No issues with past sedation with any surgeries  or procedures, no intubation problems  No diet pills per patient No home 02 use per patient  No blood thinners per patient  Pt denies issues with constipation  No A fib or A flutter  EMMI  Information in packet along with consent acknowledgement form,Suprep coupon and instructions.

## 2018-11-23 ENCOUNTER — Telehealth: Payer: Self-pay | Admitting: Gastroenterology

## 2018-11-23 NOTE — Telephone Encounter (Signed)
No to all answers. °

## 2018-11-23 NOTE — Telephone Encounter (Signed)

## 2018-11-26 ENCOUNTER — Other Ambulatory Visit: Payer: Self-pay

## 2018-11-26 ENCOUNTER — Ambulatory Visit (AMBULATORY_SURGERY_CENTER): Payer: 59 | Admitting: Gastroenterology

## 2018-11-26 ENCOUNTER — Encounter: Payer: Self-pay | Admitting: Gastroenterology

## 2018-11-26 VITALS — BP 112/68 | HR 56 | Temp 97.5°F | Resp 17 | Ht 72.0 in | Wt 195.0 lb

## 2018-11-26 DIAGNOSIS — D123 Benign neoplasm of transverse colon: Secondary | ICD-10-CM | POA: Diagnosis not present

## 2018-11-26 DIAGNOSIS — Z86010 Personal history of colonic polyps: Secondary | ICD-10-CM | POA: Diagnosis not present

## 2018-11-26 DIAGNOSIS — D122 Benign neoplasm of ascending colon: Secondary | ICD-10-CM

## 2018-11-26 DIAGNOSIS — D129 Benign neoplasm of anus and anal canal: Secondary | ICD-10-CM

## 2018-11-26 DIAGNOSIS — D128 Benign neoplasm of rectum: Secondary | ICD-10-CM

## 2018-11-26 MED ORDER — SODIUM CHLORIDE 0.9 % IV SOLN: 500.0000 mL | Freq: Once | INTRAVENOUS | Status: DC

## 2018-11-26 NOTE — Patient Instructions (Signed)
YOU HAD AN ENDOSCOPIC PROCEDURE TODAY AT THE [facility]:   Refer to the procedure report that was given to you for any specific questions about what was found during the examination.  If the procedure report does not answer your questions, please call your gastroenterologist to clarify.  If you requested that your care partner not be given the details of your procedure findings, then the procedure report has been included in a sealed envelope for you to review at your convenience later.  YOU SHOULD EXPECT: Some feelings of bloating in the abdomen. Passage of more gas than usual.  Walking can help get rid of the air that was put into your GI tract during the procedure and reduce the bloating. If you had a lower endoscopy (such as a colonoscopy or flexible sigmoidoscopy) you may notice spotting of blood in your stool or on the toilet paper. If you underwent a bowel prep for your procedure, you may not have a normal bowel movement for a few days.  Please Note:  You might notice some irritation and congestion in your nose or some drainage.  This is from the oxygen used during your procedure.  There is no need for concern and it should clear up in a day or so.  SYMPTOMS TO REPORT IMMEDIATELY:   Following lower endoscopy (colonoscopy or flexible sigmoidoscopy):  Excessive amounts of blood in the stool  Significant tenderness or worsening of abdominal pains  Swelling of the abdomen that is new, acute  Fever of 100F or higher  For urgent or emergent issues, a gastroenterologist can be reached at any hour by calling [phone].   DIET:  We do recommend a small meal at first, but then you may proceed to your regular diet.  Drink plenty of fluids but you should avoid alcoholic beverages for 24 hours.  ACTIVITY:  You should plan to take it easy for the rest of today and you should NOT DRIVE or use heavy machinery until tomorrow (because of the sedation medicines used during the test).     FOLLOW UP: Our staff will call the number listed on your records 48-72 hours following your procedure to check on you and address any questions or concerns that you may have regarding the information given to you following your procedure. If we do not reach you, we will leave a message.  We will attempt to reach you two times.  During this call, we will ask if you have developed any symptoms of COVID 19. If you develop any symptoms (ie: fever, flu-like symptoms, shortness of breath, cough etc.) before then, please call [phone].  If you test positive for Covid 19 in the 2 weeks post procedure, please call and report this information to us.    If any biopsies were taken you will be contacted by phone or by letter within the next 1-3 weeks.  Please call us at [phone] if you have not heard about the biopsies in 3 weeks.    SIGNATURES/CONFIDENTIALITY: You and/or your care partner have signed paperwork which will be entered into your electronic medical record.  These signatures attest to the fact that that the information above on your After Visit Summary has been reviewed and is understood.  Full responsibility of the confidentiality of this discharge information lies with you and/or your care-partner. 

## 2018-11-26 NOTE — Op Note (Signed)
[place] Patient Name: Keith Schmitt Procedure Date: 11/26/2018 10:14 AM MRN: [id number] Endoscopist: Mauri Pole , MD Age: 58 Referring MD:  Date of Birth: [birth year] Gender: Male Account #: [id number] Procedure:                Colonoscopy Indications:              High risk colon cancer surveillance: Personal                            history of colonic polyps Medicines:                Monitored Anesthesia Care Procedure:                Pre-Anesthesia Assessment:                           - Prior to the procedure, a History and Physical                            was performed, and patient medications and                            allergies were reviewed. The patient's tolerance of                            previous anesthesia was also reviewed. The risks                            and benefits of the procedure and the sedation                            options and risks were discussed with the patient.                            All questions were answered, and informed consent                            was obtained. Prior Anticoagulants: The patient has                            taken no previous anticoagulant or antiplatelet                            agents. ASA Grade Assessment: I - A normal, healthy                            patient. After reviewing the risks and benefits,                            the patient was deemed in satisfactory condition to                            undergo the procedure.  After obtaining informed consent, the colonoscope                            was passed under direct vision. Throughout the                            procedure, the patient's blood pressure, pulse, and                            oxygen saturations were monitored continuously. The                            Model PCF-[id number]) scope was introduced                            through the anus and advanced to the the  cecum,                            identified by appendiceal orifice and ileocecal                            valve. The colonoscopy was performed without                            difficulty. The patient tolerated the procedure                            well. The quality of the bowel preparation was                            excellent. The ileocecal valve, appendiceal                            orifice, and rectum were photographed. Scope In: 10:18:39 AM Scope Out: 10:32:46 AM Scope Withdrawal Time: 0 hours 11 minutes 22 seconds  Total Procedure Duration: 0 hours 14 minutes 7 seconds  Findings:                 The perianal and digital rectal examinations were                            normal.                           Three sessile polyps were found in the rectum and                            ascending colon. The polyps were 4 to 8 mm in size.                            These polyps were removed with a cold snare.                            Resection and retrieval were complete.  A less than 1 mm polyp was found in the transverse                            colon. The polyp was sessile. The polyp was removed                            with a cold biopsy forceps. Resection and retrieval                            were complete.                           Scattered small and large-mouthed diverticula were                            found in the descending colon, transverse colon and                            ascending colon.                           There was evidence of a prior functional end-to-end                            colo-colonic anastomosis in the recto-sigmoid                            colon, 25cm from anal verge. This was patent and                            was characterized by healthy appearing mucosa. Complications:            No immediate complications. Estimated Blood Loss:     Estimated blood loss was minimal. Impression:               -  Three 4 to 8 mm polyps in the rectum and in the                            ascending colon, removed with a cold snare.                            Resected and retrieved.                           - One less than 1 mm polyp in the transverse colon,                            removed with a cold biopsy forceps. Resected and                            retrieved.                           - Diverticulosis in the descending colon, in the  transverse colon and in the ascending colon.                           - Patent functional end-to-end colo-colonic                            anastomosis, characterized by healthy appearing                            mucosa. Recommendation:           - Patient has a contact number available for                            emergencies. The signs and symptoms of potential                            delayed complications were discussed with the                            patient. Return to normal activities tomorrow.                            Written discharge instructions were provided to the                            patient.                           - Resume previous diet.                           - Continue present medications.                           - Await pathology results.                           - Repeat colonoscopy in 3 - 5 years for                            surveillance based on pathology results. Mauri Pole, MD 11/26/2018 10:42:15 AM This report has been signed electronically.

## 2018-11-26 NOTE — Progress Notes (Signed)
Pt's states no medical or surgical changes since previsit or office visit.  JM temps and CW vitals

## 2018-11-26 NOTE — Progress Notes (Signed)
Report given to PACU, vss 

## 2018-11-26 NOTE — Progress Notes (Signed)
Called to room to assist during endoscopic procedure.  Patient ID and intended procedure confirmed with present staff. Received instructions for my participation in the procedure from the performing physician.  

## 2018-11-28 ENCOUNTER — Telehealth: Payer: Self-pay

## 2018-11-28 NOTE — Telephone Encounter (Signed)
  Follow up Call-  Call back number 11/26/2018  Post procedure Call Back phone  # [phone]  Some recent data might be hidden     Patient questions:  Do you have a fever, pain , or abdominal swelling? No. Pain Score  0 *  Have you tolerated food without any problems? Yes.    Have you been able to return to your normal activities? Yes.    Do you have any questions about your discharge instructions: Diet   No. Medications  No. Follow up visit  No.  Do you have questions or concerns about your Care? No.  Actions: * If pain score is 4 or above: 1. No action needed, pain <4.Have you developed a fever since your procedure? no  2.   Have you had an respiratory symptoms (SOB or cough) since your procedure? no  3.   Have you tested positive for COVID 19 since your procedure no  4.   Have you had any family members/close contacts diagnosed with the COVID 19 since your procedure?  no   If yes to any of these questions please route to Joylene John, RN and Alphonsa Gin, Therapist, sports.

## 2018-12-11 ENCOUNTER — Encounter: Payer: Self-pay | Admitting: Gastroenterology

## 2019-07-29 ENCOUNTER — Ambulatory Visit: Payer: 59 | Attending: Internal Medicine

## 2019-07-29 ENCOUNTER — Ambulatory Visit: Payer: 59

## 2019-07-29 DIAGNOSIS — Z23 Encounter for immunization: Secondary | ICD-10-CM

## 2019-07-29 NOTE — Progress Notes (Signed)
   Covid-19 Vaccination Clinic  Name:  Keith Schmitt    MRN: [id number] DOB: 07/15/1960  07/29/2019  Keith Schmitt was observed post Covid-19 immunization for 15 minutes without incident. He was provided with Vaccine Information Sheet and instruction to access the V-Safe system.   Keith Schmitt was instructed to call 911 with any severe reactions post vaccine: Marland Kitchen Difficulty breathing  . Swelling of face and throat  . A fast heartbeat  . A bad rash all over body  . Dizziness and weakness

## 2021-11-25 ENCOUNTER — Encounter: Payer: Self-pay | Admitting: Gastroenterology

## 2024-01-04 ENCOUNTER — Encounter: Payer: Self-pay | Admitting: Gastroenterology

## 2024-02-20 ENCOUNTER — Ambulatory Visit

## 2024-02-20 ENCOUNTER — Other Ambulatory Visit: Payer: Self-pay

## 2024-02-20 VITALS — Ht 72.0 in | Wt 200.0 lb

## 2024-02-20 DIAGNOSIS — Z860100 Personal history of colon polyps, unspecified: Secondary | ICD-10-CM

## 2024-02-20 MED ORDER — NA SULFATE-K SULFATE-MG SULF 17.5-3.13-1.6 GM/177ML PO SOLN: 1.0000 | Freq: Once | ORAL | 0 refills | Status: AC

## 2024-02-20 NOTE — Progress Notes (Signed)
 Denies allergies to eggs or soy products. Denies complication of anesthesia or sedation. Denies use of weight loss medication. Denies use of O2.   Emmi instructions given for colonoscopy.

## 2024-03-01 ENCOUNTER — Encounter: Payer: Self-pay | Admitting: Gastroenterology

## 2024-03-11 ENCOUNTER — Ambulatory Visit (AMBULATORY_SURGERY_CENTER): Admitting: Gastroenterology

## 2024-03-11 ENCOUNTER — Encounter: Payer: Self-pay | Admitting: Gastroenterology

## 2024-03-11 VITALS — BP 128/84 | HR 59 | Temp 97.7°F | Resp 10 | Ht 71.5 in | Wt 200.0 lb

## 2024-03-11 DIAGNOSIS — K644 Residual hemorrhoidal skin tags: Secondary | ICD-10-CM

## 2024-03-11 DIAGNOSIS — K648 Other hemorrhoids: Secondary | ICD-10-CM

## 2024-03-11 DIAGNOSIS — Z860101 Personal history of adenomatous and serrated colon polyps: Secondary | ICD-10-CM

## 2024-03-11 DIAGNOSIS — K5730 Diverticulosis of large intestine without perforation or abscess without bleeding: Secondary | ICD-10-CM | POA: Diagnosis not present

## 2024-03-11 DIAGNOSIS — D120 Benign neoplasm of cecum: Secondary | ICD-10-CM | POA: Diagnosis not present

## 2024-03-11 DIAGNOSIS — D123 Benign neoplasm of transverse colon: Secondary | ICD-10-CM

## 2024-03-11 DIAGNOSIS — Z1211 Encounter for screening for malignant neoplasm of colon: Secondary | ICD-10-CM | POA: Diagnosis present

## 2024-03-11 DIAGNOSIS — D128 Benign neoplasm of rectum: Secondary | ICD-10-CM

## 2024-03-11 DIAGNOSIS — Z860100 Personal history of colon polyps, unspecified: Secondary | ICD-10-CM

## 2024-03-11 DIAGNOSIS — D122 Benign neoplasm of ascending colon: Secondary | ICD-10-CM | POA: Diagnosis not present

## 2024-03-11 MED ORDER — SODIUM CHLORIDE 0.9 % IV SOLN: 500.0000 mL | Freq: Once | INTRAVENOUS | Status: AC

## 2024-03-11 NOTE — Progress Notes (Unsigned)
 Called to room to assist during endoscopic procedure.  Patient ID and intended procedure confirmed with present staff. Received instructions for my participation in the procedure from the performing physician.

## 2024-03-11 NOTE — Op Note (Signed)
 [facility] Patient Name: Keith Schmitt Procedure Date: 03/11/2024 8:40 AM MRN: [id number] Endoscopist: Gustav ALONSO Mcgee , MD, [id number] Age: 63 Referring MD:  Date of Birth: 07/21/1960 Gender: Male Account #: [id number] Procedure:                Colonoscopy Indications:              High risk colon cancer surveillance: Personal                            history of multiple (3 or more) adenomas Medicines:                Monitored Anesthesia Care Procedure:                Pre-Anesthesia Assessment:                           - Prior to the procedure, a History and Physical                            was performed, and patient medications and                            allergies were reviewed. The patient's tolerance of                            previous anesthesia was also reviewed. The risks                            and benefits of the procedure and the sedation                            options and risks were discussed with the patient.                            All questions were answered, and informed consent                            was obtained. Prior Anticoagulants: The patient has                            taken no anticoagulant or antiplatelet agents. ASA                            Grade Assessment: II - A patient with mild systemic                            disease. After reviewing the risks and benefits,                            the patient was deemed in satisfactory condition to                            undergo the procedure.  After obtaining informed consent, the colonoscope                            was passed under direct vision. Throughout the                            procedure, the patient's blood pressure, pulse, and                            oxygen saturations were monitored continuously. The                            PCF-HQ190L Colonoscope [id number] was introduced                            through the anus  and advanced to the the cecum,                            identified by appendiceal orifice and ileocecal                            valve. The colonoscopy was performed without                            difficulty. The patient tolerated the procedure                            well. The quality of the bowel preparation was                            good. The ileocecal valve, appendiceal orifice, and                            rectum were photographed. Scope In: 8:47:41 AM Scope Out: 9:04:14 AM Scope Withdrawal Time: 0 hours 13 minutes 16 seconds  Total Procedure Duration: 0 hours 16 minutes 33 seconds  Findings:                 The perianal and digital rectal examinations were                            normal.                           Four sessile polyps were found in the transverse                            colon, ascending colon and cecum. The polyps were 4                            to 8 mm in size. These polyps were removed with a                            cold snare. Resection and retrieval were complete.  There was evidence of a prior functional end-to-end                            colo-colonic anastomosis in the recto-sigmoid                            colon. This was patent and was characterized by                            healthy appearing mucosa. The anastomosis was                            traversed.                           A few small-mouthed diverticula were found in the                            descending colon.                           A 5 mm polyp was found in the rectum. The polyp was                            semi-pedunculated. The polyp was removed with a hot                            snare. Resection and retrieval were complete.                           Non-bleeding external and internal hemorrhoids were                            found during retroflexion. The hemorrhoids were                             small. Complications:            No immediate complications. Estimated Blood Loss:     Estimated blood loss was minimal. Impression:               - Four 4 to 8 mm polyps in the transverse colon, in                            the ascending colon and in the cecum, removed with                            a cold snare. Resected and retrieved.                           - Patent functional end-to-end colo-colonic                            anastomosis, characterized by healthy appearing  mucosa.                           - Diverticulosis in the descending colon.                           - One 5 mm polyp in the rectum, removed with a hot                            snare. Resected and retrieved.                           - Non-bleeding external and internal hemorrhoids. Recommendation:           - Patient has a contact number available for                            emergencies. The signs and symptoms of potential                            delayed complications were discussed with the                            patient. Return to normal activities tomorrow.                            Written discharge instructions were provided to the                            patient.                           - Resume previous diet.                           - Continue present medications.                           - Await pathology results.                           - Repeat colonoscopy in 3 years for surveillance                            based on pathology results. Siddiq Kaluzny V. Avalie Oconnor, MD 03/11/2024 9:15:52 AM This report has been signed electronically.

## 2024-03-11 NOTE — Progress Notes (Unsigned)
 [facility] History and Physical   Primary Care Physician:  Pcp, No   Reason for Procedure:  History of adenomatous colon polyps  Plan:    Surveillance colonoscopy with possible interventions as needed     HPI: Keith Schmitt is a very pleasant 63 y.o. male here for surveillance colonoscopy. Denies any nausea, vomiting, abdominal pain, melena or bright red blood per rectum  The risks and benefits as well as alternatives of endoscopic procedure(s) have been discussed and reviewed.  The patient was provided an opportunity to ask questions and all were answered. The patient agreed with the plan and demonstrated an understanding of the instructions.   Past Medical History:  Diagnosis Date   Abnormality of gait    Allergy    Arthritis    Colon polyp    Tubular Adenoma    Diverticulitis    S/P partial colectomy   Diverticulosis of colon (without mention of hemorrhage)    Elbow pain, left    left elbow pain intermittent 0-5'   Heel pain 01-23-13   left heel pain level 0-3   History of chickenpox    History of Osgood-Schlatter disease    Hyperlipidemia    high bad cholesterol   NEVUS, ATYPICAL    Rotator cuff tendonitis    no problem now in2 months- left shoulder   Stress fracture of left tibia with delayed healing    shin area left knee-is improved   UNEQUAL LEG LENGTH     Past Surgical History:  Procedure Laterality Date   APPENDECTOMY  1999   Dr Eletha   COLONOSCOPY     LAPAROSCOPIC PARTIAL COLECTOMY N/A 01/31/2013   Procedure: LAPAROSCOPIC PARTIAL SIGMOIDCOLECTOMY;  Surgeon: Elspeth KYM Schultze, MD;  Location: [facility];  Service: General;  Laterality: N/A;   LAPAROSCOPIC SIGMOID COLECTOMY  01/31/2013   Dr Schultze   POLYPECTOMY     PROCTOSCOPY N/A 01/31/2013   Procedure: RIGID PROCTOSCOPY;  Surgeon: Elspeth KYM Schultze, MD;  Location: [facility];  Service: General;  Laterality: N/A;   REFRACTIVE SURGERY  2003    Dr Meridee   SHOULDER SURGERY     surgery R  knee post trauma  1988   bone fragment   VASECTOMY  1998    Prior to Admission medications   Medication Sig Start Date End Date Taking? Authorizing Provider  aspirin EC 81 MG tablet Take 81 mg by mouth daily.   Yes [provider]  Multiple Vitamin (MULTIVITAMIN) tablet Take 1 tablet by mouth daily.   Yes [provider]  VITAMIN D PO Take by mouth.    [provider]    Current Outpatient Medications  Medication Sig Dispense Refill   aspirin EC 81 MG tablet Take 81 mg by mouth daily.     Multiple Vitamin (MULTIVITAMIN) tablet Take 1 tablet by mouth daily.     VITAMIN D PO Take by mouth.     Current Facility-Administered Medications  Medication Dose Route Frequency Provider Last Rate Last Admin   0.9 %  sodium chloride  infusion  500 mL Intravenous Once [name], MD        Allergies as of 03/11/2024 - Review Complete 02/20/2024  Allergen Reaction Noted   Vytorin [ezetimibe-simvastatin] Other (See Comments) 01/24/2012    Family History  Problem Relation Age of Onset   Heart attack Father 57   Alcohol abuse Father    Heart attack Mother 26   Alcohol abuse Mother    Heart attack Paternal  Uncle 54   Cancer Neg Hx    Diabetes Neg Hx    Stroke Neg Hx    Colon cancer Neg Hx    Colon polyps Neg Hx    Esophageal cancer Neg Hx    Rectal cancer Neg Hx    Stomach cancer Neg Hx     Social History   Socioeconomic History   Marital status: Married    Spouse name: Not on file   Number of children: Not on file   Years of education: Not on file   Highest education level: Not on file  Occupational History   Not on file  Tobacco Use   Smoking status: Never   Smokeless tobacco: Never  Vaping Use   Vaping status: Never Used  Substance and Sexual Activity   Alcohol use: Yes    Alcohol/week: 2.0 standard drinks of alcohol    Types: 2 Cans of beer per week    Comment: weekly   Drug use: No   Sexual activity: Yes  Other Topics Concern    Not on file  Social History Narrative   Not on file   Social Drivers of Health   Financial Resource Strain: Not on file  Food Insecurity: No Food Insecurity (06/16/2021)   Received from [facility]   Hunger Vital Sign    Within the past 12 months, you worried that your food would run out before you got the money to buy more.: Never true    Within the past 12 months, the food you bought just didn't last and you didn't have money to get more.: Never true  Transportation Needs: Not on file  Physical Activity: Not on file  Stress: Not on file  Social Connections: Unknown (09/13/2021)   Received from [facility]   Social Network    Social Network: Not on file  Intimate Partner Violence: Unknown (08/05/2021)   Received from [facility]   HITS    Physically Hurt: Not on file    Insult or Talk Down To: Not on file    Threaten Physical Harm: Not on file    Scream or Curse: Not on file    Review of Systems:  All other review of systems negative except as mentioned in the HPI.  Physical Exam: Vital signs in last 24 hours: BP (!) 143/103   Pulse 67   Temp 97.7 F (36.5 C) (Temporal)   Ht 5' 11.5 (1.816 m)   Wt 200 lb (90.7 kg)   SpO2 96%   BMI 27.51 kg/m  General:   Alert, NAD Lungs:  Clear .   Heart:  Regular rate and rhythm Abdomen:  Soft, nontender and nondistended. Neuro/Psych:  Alert and cooperative. Normal mood and affect. A and O x 3  Reviewed labs, radiology imaging, old records and pertinent past GI work up  Patient is appropriate for planned procedure(s) and anesthesia in an ambulatory setting   K. Veena Chancy Smigiel , MD [phone]

## 2024-03-11 NOTE — Patient Instructions (Addendum)
 YOU HAD AN ENDOSCOPIC PROCEDURE TODAY AT THE [facility]:   Refer to the procedure report that was given to you for any specific questions about what was found during the examination.  If the procedure report does not answer your questions, please call your gastroenterologist to clarify.  If you requested that your care partner not be given the details of your procedure findings, then the procedure report has been included in a sealed envelope for you to review at your convenience later.  YOU SHOULD EXPECT: Some feelings of bloating in the abdomen. Passage of more gas than usual.  Walking can help get rid of the air that was put into your GI tract during the procedure and reduce the bloating. If you had a lower endoscopy (such as a colonoscopy or flexible sigmoidoscopy) you may notice spotting of blood in your stool or on the toilet paper. If you underwent a bowel prep for your procedure, you may not have a normal bowel movement for a few days.  Please Note:  You might notice some irritation and congestion in your nose or some drainage.  This is from the oxygen used during your procedure.  There is no need for concern and it should clear up in a day or so.  SYMPTOMS TO REPORT IMMEDIATELY:  Following lower endoscopy (colonoscopy or flexible sigmoidoscopy):  Excessive amounts of blood in the stool  Significant tenderness or worsening of abdominal pains  Swelling of the abdomen that is new, acute  Fever of 100F or higher   For urgent or emergent issues, a gastroenterologist can be reached at any hour by calling [phone]. Do not use MyChart messaging for urgent concerns.    DIET:  We do recommend a small meal at first, but then you may proceed to your regular diet.  Drink plenty of fluids but you should avoid alcoholic beverages for 24 hours.  MEDICATIONS: Continue present medications.  FOLLOW UP: Await pathology results. Repeat colonoscopy in 3 years for surveillance based on  pathology results.  Educational handouts given to patient: Polyps, Diverticulosis, Hemorrhoids.  Thank you for allowing us  to provide for your healthcare needs today,  ACTIVITY:  You should plan to take it easy for the rest of today and you should NOT DRIVE or use heavy machinery until tomorrow (because of the sedation medicines used during the test).    FOLLOW UP: Our staff will call the number listed on your records the next business day following your procedure.  We will call around 7:15- 8:00 am to check on you and address any questions or concerns that you may have regarding the information given to you following your procedure. If we do not reach you, we will leave a message.     If any biopsies were taken you will be contacted by phone or by letter within the next 1-3 weeks.  Please call us  at [phone] if you have not heard about the biopsies in 3 weeks.    SIGNATURES/CONFIDENTIALITY: You and/or your care partner have signed paperwork which will be entered into your electronic medical record.  These signatures attest to the fact that that the information above on your After Visit Summary has been reviewed and is understood.  Full responsibility of the confidentiality of this discharge information lies with you and/or your care-partner.

## 2024-03-11 NOTE — Progress Notes (Unsigned)
 To pacu, VSS. Report to [email or web address]

## 2024-03-12 ENCOUNTER — Telehealth: Payer: Self-pay | Admitting: *Deleted

## 2024-03-12 NOTE — Telephone Encounter (Signed)
 No answer for post procedure call back. Left VM.

## 2024-03-14 ENCOUNTER — Ambulatory Visit: Payer: Self-pay | Admitting: Gastroenterology

## 2024-03-14 LAB — SURGICAL PATHOLOGY
